# Patient Record
Sex: Male | Born: 1955 | Race: White | Hispanic: No | Marital: Married | Smoking: Never smoker
Health system: Southern US, Community
[De-identification: ages and names within clinical notes are randomized; demographics above are authoritative.]

## PROBLEM LIST (undated history)

## (undated) DIAGNOSIS — E785 Hyperlipidemia, unspecified: Secondary | ICD-10-CM

## (undated) DIAGNOSIS — E669 Obesity, unspecified: Secondary | ICD-10-CM

## (undated) DIAGNOSIS — I10 Essential (primary) hypertension: Secondary | ICD-10-CM

## (undated) DIAGNOSIS — E119 Type 2 diabetes mellitus without complications: Secondary | ICD-10-CM

## (undated) HISTORY — PX: KNEE SURGERY: SHX244

## (undated) HISTORY — DX: Essential (primary) hypertension: I10

## (undated) HISTORY — DX: Hyperlipidemia, unspecified: E78.5

## (undated) HISTORY — DX: Obesity, unspecified: E66.9

## (undated) HISTORY — DX: Type 2 diabetes mellitus without complications: E11.9

---

## 2012-02-29 ENCOUNTER — Ambulatory Visit: Payer: Self-pay | Admitting: Internal Medicine

## 2012-03-01 ENCOUNTER — Other Ambulatory Visit (INDEPENDENT_AMBULATORY_CARE_PROVIDER_SITE_OTHER): Payer: BC Managed Care – PPO

## 2012-03-01 ENCOUNTER — Encounter: Payer: Self-pay | Admitting: Internal Medicine

## 2012-03-01 ENCOUNTER — Ambulatory Visit (INDEPENDENT_AMBULATORY_CARE_PROVIDER_SITE_OTHER): Payer: BC Managed Care – PPO | Admitting: Internal Medicine

## 2012-03-01 VITALS — BP 140/98 | HR 103 | Temp 98.8°F | Ht 72.0 in | Wt 319.2 lb

## 2012-03-01 DIAGNOSIS — Z125 Encounter for screening for malignant neoplasm of prostate: Secondary | ICD-10-CM

## 2012-03-01 DIAGNOSIS — Z13220 Encounter for screening for lipoid disorders: Secondary | ICD-10-CM

## 2012-03-01 DIAGNOSIS — Z1211 Encounter for screening for malignant neoplasm of colon: Secondary | ICD-10-CM

## 2012-03-01 DIAGNOSIS — Z130 Encounter for screening for diseases of the blood and blood-forming organs and certain disorders involving the immune mechanism: Secondary | ICD-10-CM

## 2012-03-01 DIAGNOSIS — Z131 Encounter for screening for diabetes mellitus: Secondary | ICD-10-CM

## 2012-03-01 DIAGNOSIS — Z0000 Encounter for general adult medical examination without abnormal findings: Secondary | ICD-10-CM

## 2012-03-01 LAB — BASIC METABOLIC PANEL
BUN: 23 mg/dL (ref 6–23)
Calcium: 9.6 mg/dL (ref 8.4–10.5)
Chloride: 104 mEq/L (ref 96–112)
Creatinine, Ser: 1.2 mg/dL (ref 0.4–1.5)

## 2012-03-01 LAB — CBC
HCT: 39.7 % (ref 39.0–52.0)
Hemoglobin: 13.7 g/dL (ref 13.0–17.0)
MCHC: 34.4 g/dL (ref 30.0–36.0)
MCV: 92.9 fl (ref 78.0–100.0)
Platelets: 272 10*3/uL (ref 150.0–400.0)

## 2012-03-01 LAB — LIPID PANEL
Cholesterol: 241 mg/dL — ABNORMAL HIGH (ref 0–200)
Total CHOL/HDL Ratio: 6

## 2012-03-01 NOTE — Patient Instructions (Signed)
Health Maintenance, Males A healthy lifestyle and preventative care can promote health and wellness.  Maintain regular health, dental, and eye exams.  Eat a healthy diet. Foods like vegetables, fruits, whole grains, low-fat dairy products, and lean protein foods contain the nutrients you need without too many calories. Decrease your intake of foods high in solid fats, added sugars, and salt. Get information about a proper diet from your caregiver, if necessary.  Regular physical exercise is one of the most important things you can do for your health. Most adults should get at least 150 minutes of moderate-intensity exercise (any activity that increases your heart rate and causes you to sweat) each week. In addition, most adults need muscle-strengthening exercises on 2 or more days a week.   Maintain a healthy weight. The body mass index (BMI) is a screening tool to identify possible weight problems. It provides an estimate of body fat based on height and weight. Your caregiver can help determine your BMI, and can help you achieve or maintain a healthy weight. For adults 20 years and older:  A BMI below 18.5 is considered underweight.  A BMI of 18.5 to 24.9 is normal.  A BMI of 25 to 29.9 is considered overweight.  A BMI of 30 and above is considered obese.  Maintain normal blood lipids and cholesterol by exercising and minimizing your intake of saturated fat. Eat a balanced diet with plenty of fruits and vegetables. Blood tests for lipids and cholesterol should begin at age 20 and be repeated every 5 years. If your lipid or cholesterol levels are high, you are over 50, or you are a high risk for heart disease, you may need your cholesterol levels checked more frequently.Ongoing high lipid and cholesterol levels should be treated with medicines, if diet and exercise are not effective.  If you smoke, find out from your caregiver how to quit. If you do not use tobacco, do not start.  If you  choose to drink alcohol, do not exceed 2 drinks per day. One drink is considered to be 12 ounces (355 mL) of beer, 5 ounces (148 mL) of wine, or 1.5 ounces (44 mL) of liquor.  Avoid use of street drugs. Do not share needles with anyone. Ask for help if you need support or instructions about stopping the use of drugs.  High blood pressure causes heart disease and increases the risk of stroke. Blood pressure should be checked at least every 1 to 2 years. Ongoing high blood pressure should be treated with medicines if weight loss and exercise are not effective.  If you are 45 to 55 years old, ask your caregiver if you should take aspirin to prevent heart disease.  Diabetes screening involves taking a blood sample to check your fasting blood sugar level. This should be done once every 3 years, after age 45, if you are within normal weight and without risk factors for diabetes. Testing should be considered at a younger age or be carried out more frequently if you are overweight and have at least 1 risk factor for diabetes.  Colorectal cancer can be detected and often prevented. Most routine colorectal cancer screening begins at the age of 50 and continues through age 75. However, your caregiver may recommend screening at an earlier age if you have risk factors for colon cancer. On a yearly basis, your caregiver may provide home test kits to check for hidden blood in the stool. Use of a small camera at the end of a tube,   to directly examine the colon (sigmoidoscopy or colonoscopy), can detect the earliest forms of colorectal cancer. Talk to your caregiver about this at age 50, when routine screening begins. Direct examination of the colon should be repeated every 5 to 10 years through age 75, unless early forms of pre-cancerous polyps or small growths are found.  Hepatitis C blood testing is recommended for all people born from 1945 through 1965 and any individual with known risks for hepatitis C.  Healthy  men should no longer receive prostate-specific antigen (PSA) blood tests as part of routine cancer screening. Consult with your caregiver about prostate cancer screening.  Testicular cancer screening is not recommended for adolescents or adult males who have no symptoms. Screening includes self-exam, caregiver exam, and other screening tests. Consult with your caregiver about any symptoms you have or any concerns you have about testicular cancer.  Practice safe sex. Use condoms and avoid high-risk sexual practices to reduce the spread of sexually transmitted infections (STIs).  Use sunscreen with a sun protection factor (SPF) of 30 or greater. Apply sunscreen liberally and repeatedly throughout the day. You should seek shade when your shadow is shorter than you. Protect yourself by wearing long sleeves, pants, a wide-brimmed hat, and sunglasses year round, whenever you are outdoors.  Notify your caregiver of new moles or changes in moles, especially if there is a change in shape or color. Also notify your caregiver if a mole is larger than the size of a pencil eraser.  A one-time screening for abdominal aortic aneurysm (AAA) and surgical repair of large AAAs by sound wave imaging (ultrasonography) is recommended for ages 65 to 75 years who are current or former smokers.  Stay current with your immunizations. Document Released: 08/15/2007 Document Revised: 05/11/2011 Document Reviewed: 07/14/2010 [employer] Patient Information 2013 [employer].  

## 2012-03-01 NOTE — Progress Notes (Signed)
HPI  Pt presents to the clinic today to establish care. He has not seen a PCP in a number of years. He has no concerns today. He is accompanied by his daughter who does have some concerns today. 1: he snores very loudly and sometimes goes for periods of time without breathing. He falls asleep very easy during the day. He has been doing this for a number of years. 2. He has a lot of swelling in his legs. This is new over the past year. They have no idea where it is coming from. It is better for thing in the morning and gets worse as the day progresses.  Flu: never Colonoscopy: never Prostate screening: never Eye doctor: yearly Dentist: more than 2 years ago. Tetanus: greater than 10 years ago  History reviewed. No pertinent past medical history.  No current outpatient prescriptions on file.    No Known Allergies  Family History  Problem Relation Age of Onset  . Heart disease Father   . Cancer Neg Hx   . Diabetes Neg Hx   . Stroke Neg Hx     History   Social History  . Marital Status: Single    Spouse Name: N/A    Number of Children: N/A  . Years of Education: N/A   Occupational History  . Not on file.   Social History Main Topics  . Smoking status: Current Every Day Smoker -- 1.0 packs/day for 15 years  . Smokeless tobacco: Never Used  . Alcohol Use: 16.8 oz/week    28 Cans of beer per week  . Drug Use: No  . Sexually Active: Not on file   Other Topics Concern  . Not on file   Social History Narrative  . No narrative on file    ROS:  Constitutional: Denies fever, malaise, fatigue, headache or abrupt weight changes.  HEENT: Denies eye pain, eye redness, ear pain, ringing in the ears, wax buildup, runny nose, nasal congestion, bloody nose, or sore throat. Respiratory: Denies difficulty breathing, shortness of breath, cough or sputum production.   Cardiovascular: Pt does reports bilateral ankle swelling. Denies chest pain, chest tightness, palpitations or swelling  in the hands.  Gastrointestinal: Denies abdominal pain, bloating, constipation, diarrhea or blood in the stool.  GU: Denies frequency, urgency, pain with urination, blood in urine, odor or discharge. Musculoskeletal: Denies decrease in range of motion, difficulty with gait, muscle pain or joint pain and swelling.  Skin: Denies redness, rashes, lesions or ulcercations.  Neurological: Denies dizziness, difficulty with memory, difficulty with speech or problems with balance and coordination.   No other specific complaints in a complete review of systems (except as listed in HPI above).  PE:  BP 140/98  Pulse 103  Temp 98.8 F (37.1 C) (Oral)  Ht 6' (1.829 m)  Wt 319 lb 3.2 oz (144.788 kg)  BMI 43.29 kg/m2  SpO2 98% Wt Readings from Last 3 Encounters:  03/01/12 319 lb 3.2 oz (144.788 kg)    General: Appears his stated age, obese but well developed, well nourished in NAD. HEENT: Head: normal shape and size; Eyes: sclera white, no icterus, conjunctiva pink, PERRLA and EOMs intact; Ears: Tm's gray and intact, normal light reflex; Nose: mucosa pink and moist, septum midline; Throat/Mouth: Teeth present, mucosa pink and moist, no lesions or ulcerations noted.  Neck: Normal range of motion. Neck supple, trachea midline. No massses, lumps or thyromegaly present.  Cardiovascular: Normal rate and rhythm. S1,S2 noted.  No murmur, rubs or gallops  noted. No JVD. No carotid bruits noted. 2+ pitting edema of the BLE. Pulmonary/Chest: Normal effort and positive vesicular breath sounds. No respiratory distress. No wheezes, rales or ronchi noted.  Abdomen: Soft and nontender. Normal bowel sounds, no bruits noted. No distention or masses noted. Liver, spleen and kidneys non palpable. Musculoskeletal: Normal range of motion. No signs of joint swelling. No difficulty with gait.  Neurological: Alert and oriented. Cranial nerves II-XII intact. Coordination normal. +DTRs bilaterally. Psychiatric: Mood and affect  normal. Behavior is normal. Judgment and thought content normal.    Assessment and Plan:  Preventative Health Maintenance:  Start a diet and exercise program Smoking cessation counseling given Pt declines flu vaccine Tdap given today Will obtain basic screening labs amb ref to GI for screening colonoscopy  Snoring, likely due to sleep apnea, new onset with additional workup required:  Pt not ready to have sleep study at this time. Will readdress at a later date  Elevated blood pressures:  I would like to start the patient on HCTZ for blood pressure and swelling. Patient would like to think about this before starting this med  RTC after we receive lab results

## 2012-03-03 ENCOUNTER — Encounter: Payer: Self-pay | Admitting: Internal Medicine

## 2012-03-07 ENCOUNTER — Ambulatory Visit: Payer: BC Managed Care – PPO | Admitting: Internal Medicine

## 2012-04-12 ENCOUNTER — Encounter: Payer: BC Managed Care – PPO | Admitting: Internal Medicine

## 2012-05-26 ENCOUNTER — Other Ambulatory Visit (INDEPENDENT_AMBULATORY_CARE_PROVIDER_SITE_OTHER): Payer: BC Managed Care – PPO

## 2012-05-26 ENCOUNTER — Ambulatory Visit (INDEPENDENT_AMBULATORY_CARE_PROVIDER_SITE_OTHER): Payer: BC Managed Care – PPO | Admitting: Internal Medicine

## 2012-05-26 ENCOUNTER — Encounter: Payer: Self-pay | Admitting: Internal Medicine

## 2012-05-26 VITALS — BP 142/88 | HR 97 | Temp 97.7°F | Ht 72.0 in | Wt 326.0 lb

## 2012-05-26 DIAGNOSIS — E785 Hyperlipidemia, unspecified: Secondary | ICD-10-CM

## 2012-05-26 DIAGNOSIS — E119 Type 2 diabetes mellitus without complications: Secondary | ICD-10-CM

## 2012-05-26 DIAGNOSIS — E669 Obesity, unspecified: Secondary | ICD-10-CM

## 2012-05-26 DIAGNOSIS — I10 Essential (primary) hypertension: Secondary | ICD-10-CM

## 2012-05-26 DIAGNOSIS — E1169 Type 2 diabetes mellitus with other specified complication: Secondary | ICD-10-CM | POA: Insufficient documentation

## 2012-05-26 LAB — HEPATIC FUNCTION PANEL
ALT: 55 U/L — ABNORMAL HIGH (ref 0–53)
Total Bilirubin: 0.5 mg/dL (ref 0.3–1.2)
Total Protein: 7.1 g/dL (ref 6.0–8.3)

## 2012-05-26 MED ORDER — HYDROCHLOROTHIAZIDE 25 MG PO TABS: 25.0000 mg | ORAL_TABLET | Freq: Every day | ORAL | Status: DC

## 2012-05-26 MED ORDER — METFORMIN HCL 500 MG PO TABS: 500.0000 mg | ORAL_TABLET | Freq: Every day | ORAL | Status: DC

## 2012-05-26 MED ORDER — SIMVASTATIN 10 MG PO TABS: 10.0000 mg | ORAL_TABLET | Freq: Every day | ORAL | Status: DC

## 2012-05-26 NOTE — Assessment & Plan Note (Signed)
Encouraged pt to start a exercise regimen Will start HCTZ 25 mg daily Try to monitor blood pressure 3 x week  RTC in 1 month for blood pressure check

## 2012-05-26 NOTE — Assessment & Plan Note (Signed)
Encouraged pt to start a exercise regimen Encouraged pt to avoid fried and fatty foods in his diet Will start Zocor 10 mg daily Will check LFT's today for baseline

## 2012-05-26 NOTE — Assessment & Plan Note (Signed)
Encourage pt to start exercise regimen Encouraged pt to avoid carbs in his diet- education provided Will start Metformin 500 mg daily  Will recheck labs in 3 months

## 2012-05-26 NOTE — Progress Notes (Signed)
Subjective:    Patient ID: Casey Clark, male    DOB: [birth year], 56 y.o.   MRN: [id number]  HPI  Pt presents to the clinic to f/u his labs. At his last visit in Dec 2013, his labs revealed that he had elevated cholesterol , LDL, triglycerides, and his HgBa1c was elevated at 6.6%. He also has high blood pressure. He does not take any medications. His does not exercise and eats fast food all of the time. 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 He is interested in trying to take better care of himself and he would like to do whatever necessary to make that happen.  Review of Systems      History reviewed. No pertinent past medical history.  Current Outpatient Prescriptions  Medication Sig Dispense Refill  . hydrochlorothiazide (HYDRODIURIL) 25 MG tablet Take 1 tablet (25 mg total) by mouth daily.  30 tablet  2  . metFORMIN (GLUCOPHAGE) 500 MG tablet Take 1 tablet (500 mg total) by mouth daily with breakfast.  30 tablet  2  . simvastatin (ZOCOR) 10 MG tablet Take 1 tablet (10 mg total) by mouth at bedtime.  90 tablet  3   No current facility-administered medications for this visit.    No Known Allergies  Family History  Problem Relation Age of Onset  . Heart disease Father   . Cancer Neg Hx   . Diabetes Neg Hx   . Stroke Neg Hx     History   Social History  . Marital Status: Single    Spouse Name: N/A    Number of Children: N/A  . Years of Education: N/A   Occupational History  . Not on file.   Social History Main Topics  . Smoking status: Current Every Day Smoker -- 1.00 packs/day for 15 years  . Smokeless tobacco: Never Used  . Alcohol Use: 16.8 oz/week    28 Cans of beer per week  . Drug Use: No  . Sexually Active: Not on file   Other Topics Concern  . Not on file   Social History Narrative  . No narrative on file     Constitutional: Denies fever, malaise, fatigue, headache or abrupt weight  changes.  HEENT: Denies eye pain, eye redness, ear pain, ringing in the ears, wax buildup, runny nose, nasal congestion, bloody nose, or sore throat. Respiratory: Denies difficulty breathing, shortness of breath, cough or sputum production.   Cardiovascular: Pt reports swelling of BLE. Denies chest pain, chest tightness, palpitations or swelling in the hands.  Skin: Denies redness, rashes, lesions or ulcercations.  Neurological: Denies dizziness, difficulty with memory, difficulty with speech or problems with balance and coordination.   No other specific complaints in a complete review of systems (except as listed in HPI above).  Objective:   Physical Exam   BP 142/88  Pulse 97  Temp(Src) 97.7 F (36.5 C) (Oral)  Ht 6' (1.829 m)  Wt 326 lb (147.873 kg)  BMI 44.2 kg/m2  SpO2 97% Wt Readings from Last 3 Encounters:  05/26/12 326 lb (147.873 kg)  03/01/12 319 lb 3.2 oz (144.788 kg)    General: Appears his stated age, well developed, well nourished in NAD. Skin: Warm, dry and intact. No rashes, lesions or ulcerations noted. HEENT: Head: normal shape and size; Eyes: sclera white, no icterus, conjunctiva pink, PERRLA and EOMs intact; Ears: Tm's gray and intact, normal light reflex; Nose:  mucosa pink and moist, septum midline; Throat/Mouth: Teeth present, mucosa pink and moist, no exudate, lesions or ulcerations noted.  Cardiovascular: Normal rate and rhythm. S1,S2 noted.  No murmur, rubs or gallops noted. No JVD. 1 + pitting edema BLE. No carotid bruits noted. Pulmonary/Chest: Normal effort and positive vesicular breath sounds. No respiratory distress. No wheezes, rales or ronchi noted.  Neurological: Alert and oriented. Cranial nerves II-XII intact. Coordination normal. +DTRs bilaterally.  BMET    Component Value Date/Time   NA 139 03/01/2012 1441   K 4.7 03/01/2012 1441   CL 104 03/01/2012 1441   CO2 27 03/01/2012 1441   GLUCOSE 144* 03/01/2012 1441   BUN 23 03/01/2012 1441    CREATININE 1.2 03/01/2012 1441   CALCIUM 9.6 03/01/2012 1441    Lipid Panel     Component Value Date/Time   CHOL 241* 03/01/2012 1441   TRIG 307.0* 03/01/2012 1441   HDL 37.50* 03/01/2012 1441   CHOLHDL 6 03/01/2012 1441   VLDL 61.4* 03/01/2012 1441    CBC    Component Value Date/Time   WBC 7.3 03/01/2012 1441   RBC 4.27 03/01/2012 1441   HGB 13.7 03/01/2012 1441   HCT 39.7 03/01/2012 1441   PLT 272.0 03/01/2012 1441   MCV 92.9 03/01/2012 1441   MCHC 34.4 03/01/2012 1441   RDW 13.4 03/01/2012 1441    Hgb A1C Lab Results  Component Value Date   HGBA1C 6.6* 03/01/2012        Assessment & Plan:

## 2012-05-26 NOTE — Patient Instructions (Signed)
Fat and Cholesterol Control Diet Cholesterol levels in your body are determined significantly by your diet. Cholesterol levels may also be related to heart disease. The following material helps to explain this relationship and discusses what you can do to help keep your heart healthy. Not all cholesterol is bad. Low-density lipoprotein (LDL) cholesterol is the "bad" cholesterol. It may cause fatty deposits to build up inside your arteries. High-density lipoprotein (HDL) cholesterol is "good." It helps to remove the "bad" LDL cholesterol from your blood. Cholesterol is a very important risk factor for heart disease. Other risk factors are high blood pressure, smoking, stress, heredity, and weight. The heart muscle gets its supply of blood through the coronary arteries. If your LDL cholesterol is high and your HDL cholesterol is low, you are at risk for having fatty deposits build up in your coronary arteries. This leaves less room through which blood can flow. Without sufficient blood and oxygen, the heart muscle cannot function properly and you may feel chest pains (angina pectoris). When a coronary artery closes up entirely, a part of the heart muscle may die causing a heart attack (myocardial infarction). CHECKING CHOLESTEROL When your caregiver sends your blood to a lab to be examined for cholesterol, a complete lipid (fat) profile may be done. With this test, the total amount of cholesterol and levels of LDL and HDL are determined. Triglycerides are a type of fat that circulates in the blood. They can also be used to determine heart disease risk. The list below describes what the numbers should be: Test: Total Cholesterol.  Less than 200 mg/dl. Test: LDL "bad cholesterol."  Less than 100 mg/dl.  Less than 70 mg/dl if you are at very high risk of a heart attack or sudden cardiac death. Test: HDL "good cholesterol."  Greater than 50 mg/dl for women.  Greater than 40 mg/dl for men. Test:  Triglycerides.  Less than 150 mg/dl. CONTROLLING CHOLESTEROL WITH DIET Although exercise and lifestyle factors are important, your diet is key. That is because certain foods are known to raise cholesterol and others to lower it. The goal is to balance foods for their effect on cholesterol and more importantly, to replace saturated and trans fat with other types of fat, such as monounsaturated fat, polyunsaturated fat, and omega-3 fatty acids. On average, a person should consume no more than 15 to 17 g of saturated fat daily. Saturated and trans fats are considered "bad" fats, and they will raise LDL cholesterol. Saturated fats are primarily found in animal products such as meats, butter, and cream. However, that does not mean you need to give up all your favorite foods. Today, there are good tasting, low-fat, low-cholesterol substitutes for most of the things you like to eat. Choose low-fat or nonfat alternatives. Choose round or loin cuts of red meat. These types of cuts are lowest in fat and cholesterol. Chicken (without the skin), fish, veal, and ground turkey breast are great choices. Eliminate fatty meats, such as hot dogs and salami. Even shellfish have little or no saturated fat. Have a 3 oz (85 g) portion when you eat lean meat, poultry, or fish. Trans fats are also called "partially hydrogenated oils." They are oils that have been scientifically manipulated so that they are solid at room temperature resulting in a longer shelf life and improved taste and texture of foods in which they are added. Trans fats are found in stick margarine, some tub margarines, cookies, crackers, and baked goods.  When baking and cooking, oils   are a great substitute for butter. The monounsaturated oils are especially beneficial since it is believed they lower LDL and raise HDL. The oils you should avoid entirely are saturated tropical oils, such as coconut and palm.  Remember to eat a lot from food groups that are  naturally free of saturated and trans fat, including fish, fruit, vegetables, beans, grains (barley, rice, couscous, bulgur wheat), and pasta (without cream sauces).  IDENTIFYING FOODS THAT LOWER CHOLESTEROL  Soluble fiber may lower your cholesterol. This type of fiber is found in fruits such as apples, vegetables such as broccoli, potatoes, and carrots, legumes such as beans, peas, and lentils, and grains such as barley. Foods fortified with plant sterols (phytosterol) may also lower cholesterol. You should eat at least 2 g per day of these foods for a cholesterol lowering effect.  Read package labels to identify low-saturated fats, trans fat free, and low-fat foods at the supermarket. Select cheeses that have only 2 to 3 g saturated fat per ounce. Use a heart-healthy tub margarine that is free of trans fats or partially hydrogenated oil. When buying baked goods (cookies, crackers), avoid partially hydrogenated oils. Breads and muffins should be made from whole grains (whole-wheat or whole oat flour, instead of "flour" or "enriched flour"). Buy non-creamy canned soups with reduced salt and no added fats.  FOOD PREPARATION TECHNIQUES  Never deep-fry. If you must fry, either stir-fry, which uses very little fat, or use non-stick cooking sprays. When possible, broil, bake, or roast meats, and steam vegetables. Instead of putting butter or margarine on vegetables, use lemon and herbs, applesauce, and cinnamon (for squash and sweet potatoes), nonfat yogurt, salsa, and low-fat dressings for salads.  LOW-SATURATED FAT / LOW-FAT FOOD SUBSTITUTES Meats / Saturated Fat (g)  Avoid: Steak, marbled (3 oz/85 g) / 11 g  Choose: Steak, lean (3 oz/85 g) / 4 g  Avoid: Hamburger (3 oz/85 g) / 7 g  Choose: Hamburger, lean (3 oz/85 g) / 5 g  Avoid: Ham (3 oz/85 g) / 6 g  Choose: Ham, lean cut (3 oz/85 g) / 2.4 g  Avoid: Chicken, with skin, dark meat (3 oz/85 g) / 4 g  Choose: Chicken, skin removed, dark meat (3  oz/85 g) / 2 g  Avoid: Chicken, with skin, light meat (3 oz/85 g) / 2.5 g  Choose: Chicken, skin removed, light meat (3 oz/85 g) / 1 g Dairy / Saturated Fat (g)  Avoid: Whole milk (1 cup) / 5 g  Choose: Low-fat milk, 2% (1 cup) / 3 g  Choose: Low-fat milk, 1% (1 cup) / 1.5 g  Choose: Skim milk (1 cup) / 0.3 g  Avoid: Hard cheese (1 oz/28 g) / 6 g  Choose: Skim milk cheese (1 oz/28 g) / 2 to 3 g  Avoid: Cottage cheese, 4% fat (1 cup) / 6.5 g  Choose: Low-fat cottage cheese, 1% fat (1 cup) / 1.5 g  Avoid: Ice cream (1 cup) / 9 g  Choose: Sherbet (1 cup) / 2.5 g  Choose: Nonfat frozen yogurt (1 cup) / 0.3 g  Choose: Frozen fruit bar / trace  Avoid: Whipped cream (1 tbs) / 3.5 g  Choose: Nondairy whipped topping (1 tbs) / 1 g Condiments / Saturated Fat (g)  Avoid: Mayonnaise (1 tbs) / 2 g  Choose: Low-fat mayonnaise (1 tbs) / 1 g  Avoid: Butter (1 tbs) / 7 g  Choose: Extra light margarine (1 tbs) / 1 g  Avoid: Coconut oil (1   tbs) / 11.8 g  Choose: Olive oil (1 tbs) / 1.8 g  Choose: Corn oil (1 tbs) / 1.7 g  Choose: Safflower oil (1 tbs) / 1.2 g  Choose: Sunflower oil (1 tbs) / 1.4 g  Choose: Soybean oil (1 tbs) / 2.4 g  Choose: Canola oil (1 tbs) / 1 g Document Released: 02/16/2005 Document Revised: 05/11/2011 Document Reviewed: 08/07/2010 [facility] Patient Information 2013 [place], [place]. Diets for Diabetes, Food Labeling Look at food labels to help you decide how much of a product you can eat. You will want to check the amount of total carbohydrate in a serving to see how the food fits into your meal plan. In the list of ingredients, the ingredient present in the largest amount by weight must be listed first, followed by the other ingredients in descending order. STANDARD OF IDENTITY Most products have a list of ingredients. However, foods that the Food and Drug Administration (FDA) has given a standard of identity do not need a list of ingredients. A  standard of identity means that a food must contain certain ingredients if it is called a particular name. Examples are mayonnaise, peanut butter, ketchup, jelly, and cheese. LABELING TERMS There are many terms found on food labels. Some of these terms have specific definitions. Some terms are regulated by the FDA, and the FDA has clearly specified how they can be used. Others are not regulated or well-defined and can be misleading and confusing. SPECIFICALLY DEFINED TERMS Nutritive Sweetener.  A sweetener that contains calories,such as table sugar or honey. Nonnutritive Sweetener.  A sweetener with few or no calories,such as saccharin, aspartame, sucralose, and cyclamate. LABELING TERMS REGULATED BY THE FDA Free.  The product contains only a tiny or small amount of fat, cholesterol, sodium, sugar, or calories. For example, a "fat-free" product will contain less than 0.5 g of fat per serving. Low.  A food described as "low" in fat, saturated fat, cholesterol, sodium, or calories could be eaten fairly often without exceeding dietary guidelines. For example, "low in fat" means no more than 3 g of fat per serving. Lean.  "Lean" and "extra lean" are U.S. Department of Agriculture Architect) terms for use on meat and poultry products. "Lean" means the product contains less than 10 g of fat, 4 g of saturated fat, and 95 mg of cholesterol per serving. "Lean" is not as low in fat as a product labeled "low." Extra Lean.  "Extra lean" means the product contains less than 5 g of fat, 2 g of saturated fat, and 95 mg of cholesterol per serving. While "extra lean" has less fat than "lean," it is still higher in fat than a product labeled "low." Reduced, Less, Fewer.  A diet product that contains 25% less of a nutrient or calories than the regular version. For example, hot dogs might be labeled "25% less fat than our regular hot dogs." Light/Lite.  A diet product that contains  fewer calories or  the fat  of the original. For example, "light in sodium" means a product with  the usual sodium. More.  One serving contains at least 10% more of the daily value of a vitamin, mineral, or fiber than usual. Good Source Of.  One serving contains 10% to 19% of the daily value for a particular vitamin, mineral, or fiber. Excellent Source Of.  One serving contains 20% or more of the daily value for a particular nutrient. Other terms used might be "high in" or "rich in." Enriched or  Fortified.  The product contains added vitamins, minerals, or protein. Nutrition labeling must be used on enriched or fortified foods. Imitation.  The product has been altered so that it is lower in protein, vitamins, or minerals than the usual food,such as imitation peanut butter. Total Fat.  The number listed is the total of all fat found in a serving of the product. Under total fat, food labels must list saturated fat and trans fat, which are associated with raising bad cholesterol and an increased risk of heart blood vessel disease. Saturated Fat.  Mainly fats from animal-based sources. Some examples are red meat, cheese, cream, whole milk, and coconut oil. Trans Fat.  Found in some fried snack foods, packaged foods, and fried restaurant foods. It is recommended you eat as close to 0 g of trans fat as possible, since it raises bad cholesterol and lowers good cholesterol. Polyunsaturated and Monounsaturated Fats.  More healthful fats. These fats are from plant sources. Total Carbohydrate.  The number of carbohydrate grams in a serving of the product. Under total carbohydrate are listed the other carbohydrate sources, such as dietary fiber and sugars. Dietary Fiber.  A carbohydrate from plant sources. Sugars.  Sugars listed on the label contain all naturally occurring sugars as well as added sugars. LABELING TERMS NOT REGULATED BY THE FDA Sugarless.  Table sugar (sucrose) has not been added. However, the  manufacturer may use another form of sugar in place of sucrose to sweeten the product. For example, sugar alcohols are used to sweeten foods. Sugar alcohols are a form of sugar but are not table sugar. If a product contains sugar alcohols in place of sucrose, it can still be labeled "sugarless." Low Salt, Salt-Free, Unsalted, No Salt, No Salt Added, Without Added Salt.  Food that is usually processed with salt has been made without salt. However, the food may contain sodium-containing additives, such as preservatives, leavening agents, or flavorings. Natural.  This term has no legal meaning. Organic.  Foods that are certified as organic have been inspected and approved by the USDA to ensure they are produced without pesticides, fertilizers containing synthetic ingredients, bioengineering, or ionizing radiation. Document Released: 02/19/2003 Document Revised: 05/11/2011 Document Reviewed: 09/06/2008 [facility] Patient Information 2013 [place], [place].

## 2012-06-23 ENCOUNTER — Ambulatory Visit: Payer: BC Managed Care – PPO | Admitting: Internal Medicine

## 2012-06-23 DIAGNOSIS — Z0289 Encounter for other administrative examinations: Secondary | ICD-10-CM

## 2012-07-14 ENCOUNTER — Encounter: Payer: Self-pay | Admitting: Internal Medicine

## 2012-07-14 ENCOUNTER — Ambulatory Visit (INDEPENDENT_AMBULATORY_CARE_PROVIDER_SITE_OTHER): Payer: BC Managed Care – PPO | Admitting: Internal Medicine

## 2012-07-14 VITALS — BP 138/88 | HR 88 | Temp 97.6°F | Ht 72.0 in | Wt 321.0 lb

## 2012-07-14 DIAGNOSIS — I10 Essential (primary) hypertension: Secondary | ICD-10-CM

## 2012-07-14 DIAGNOSIS — E785 Hyperlipidemia, unspecified: Secondary | ICD-10-CM

## 2012-07-14 DIAGNOSIS — E669 Obesity, unspecified: Secondary | ICD-10-CM

## 2012-07-14 DIAGNOSIS — E119 Type 2 diabetes mellitus without complications: Secondary | ICD-10-CM

## 2012-07-14 NOTE — Patient Instructions (Signed)
2000 Calorie Diabetic Diet  The 2000 calorie diabetic diet is designed for eating up to 2000 calories each day. Following this diet and making healthy meal choices can help improve overall health. It controls blood glucose (sugar) levels. It can also lower blood pressure and cholesterol.  SERVING SIZES  Measuring foods and serving sizes helps to make sure you are getting the right amount of food. The list below tells how big or small some common serving sizes are.  · 1 oz.........4 stacked dice.  · 3 oz.........Deck of cards.  · 1 tsp........Tip of little finger.  · 1 tbs........Thumb.  · 2 tbs........Golf ball.  · ½ cup.......Half of a fist.  · 1 cup........A fist.  GUIDELINES FOR CHOOSING FOODS  The goal of this diet is to eat a variety of foods and limit calories to 2000 each day. This can be done by choosing foods that are low in calories and fat. The diet also suggests eating small amounts of food often. Doing this helps control your blood glucose levels so they do not get too high or too low. Each meal or snack should contain a protein food source to help you feel more satisfied and to stabilize your blood glucose. Try to eat about the same amount of food around the same time each day. This includes weekend days, travel days, and days off work. Space your meals about 4 to 5 hours apart and add a snack between them if you wish.  For example, a daily food plan could include breakfast, a morning snack, lunch, dinner, and an evening snack. Healthy meals and snacks include whole grains, vegetables, fruits, lean meats, poultry, fish, and dairy products. As you plan your meals, choose a variety of foods. Choose from the bread and starches, vegetables, fruit, dairy, and meat/protein groups. Examples of foods from each group are listed below with their suggested serving sizes. Use measuring cups and spoons to become familiar with what a healthy portion looks like.  Bread and Starches  Each serving equals 15 grams of  carbohydrates.  · 1 slice bread.  · ¼ bagel.  · ¾ cup or 1 cup cold cereal (unsweetened).  · ½ cup hot cereal or mashed potatoes.  · 1 small potato (size of a computer mouse).  · ? cup cooked pasta or rice.  · ½ English muffin.  · 1 cup broth-based soup.  · 3 cups popcorn.  · 4 to 6 whole-wheat crackers.  · ½ cup cooked beans, peas, or corn.  Vegetables  Each serving equals 5 grams of carbohydrates.  · ½ cup cooked vegetables.  · 1 cup raw vegetables.  · ½ cup tomato juice.  Fruit  Each serving equals 15 grams of carbohydrates.  · 1 small apple, banana, or orange.  · 1 ¼ cup watermelon or strawberries.  · ½ cup applesauce (no sugar added).  · 2 tbs raisins.  · ½ banana.  · ½ cup unsweetened canned fruit.  · ½ cup unsweetened fruit juice.  Dairy  Each serving equals 12 to 15 grams of carbohydrates.  · 1 cup fat-free milk.  · 6 oz artificially sweetened yogurt.  · 1 cup buttermilk.  · 1 cup soy milk.  Meat/Protein  · 1 large egg.  · 2 to 3 oz meat, poultry, or fish.  · ½ cup cottage cheese.  · 1 tbs peanut butter.  · ½ cup tofu.  · 1 oz cheese.  · ¼ cup tuna canned in water.  SAMPLE   2000 CALORIE DIET PLAN  Breakfast  · 1 English muffin (2 carb servings).  · Reduced fat cream cheese, 1 tbs.  · 1 scrambled egg.  · ½ grapefruit (1 carb serving).  · Fat-free milk, 1 cup (1 carb serving).  Morning Snack  · Artificially sweetened yogurt, 6 oz (1 carb serving).  · 2 tbs chopped nuts.  · 1 small peach (1 carb serving).  Lunch  · Grilled chicken sandwich.  · 1 hamburger bun (2 carb servings).  · 2 oz chicken breast.  · 1 lettuce leaf.  · 2 slices tomato.  · Reduced fat mayonnaise, 1 tbs.  · Carrot sticks, 1 cup.  · Celery, 1 cup.  · 1 small apple (1 carb serving).  · Fat-free milk, 1 cup (1 carb serving).  Afternoon Snack  · ½ cup low-fat cottage cheese.  · 1 ¼ cups strawberries (1 carb serving).  Dinner  · Steak fajitas.  · 2 oz lean steak.  · 1 whole-wheat tortilla, 8 inches (1 ½ carb servings).  · Shredded lettuce, ¼  cup.  · 2 slices tomato.  · Salsa, ¼ cup.  · Low-fat sour cream, 2 tbs.  · Brown rice, ? cup (1 carb serving).  · 1 small orange (1 carb serving).  Evening Snack  · 4 reduced fat whole-wheat crackers (1 carb serving).  · 1 tbs peanut butter.  · 12 to 15 grapes (1 carb serving).  MEAL PLAN  Use this worksheet to help you make a daily meal plan based on the 2000 calorie diabetic diet suggestions. The total amount of carbohydrates in your meal or snack is more important than making sure you include all of the food groups at every meal or snack. If you are using this plan to help you control your blood glucose, you may interchange carbohydrate containing foods (dairy, starches, and fruits). Choose a variety of fresh foods of varying colors and flavors. You can choose from the following foods to build your day's meals:  · 11 Starches.  · 4 Vegetables.  · 3 Fruits.  · 3 Dairy.  · 8 oz Meat.  · Up to 6 Fats.  Your dietician can use this worksheet to help you decide how many servings and what types of foods are right for you.  BREAKFAST  Food Group and Servings / Food Choice  Starches ___________________________________________  Dairy ______________________________________________  Fruit ______________________________________________  Meat ______________________________________________  Fat________________________________________________  LUNCH  Food Group and Servings / Food Choice  Starch _____________________________________________  Meat ______________________________________________  Vegetables _________________________________________  Fruit ______________________________________________  Dairy______________________________________________  Fat________________________________________________  AFTERNOON SNACK  Food Group and Servings / Food  Choice  Starch________________________________________________  Meat_________________________________________________  Fruit__________________________________________________  DINNER  Food Group and Servings / Food Choice  Starches ____________________________________________  Meat _______________________________________________  Dairy _______________________________________________  Vegetables __________________________________________  Fruit ________________________________________________  Fat_________________________________________________  EVENING SNACK  Food Group and Servings / Food Choice  Fruit _______________________________________________  Meat _______________________________________________  Starch ______________________________________________  DAILY TOTALS  Starches ________________________  Vegetables ______________________  Fruit ___________________________  Dairy ___________________________  Meat ___________________________  Fat _____________________________  Document Released: 09/08/2004 Document Revised: 05/11/2011 Document Reviewed: 09/24/2008  [employer]® Patient Information ©2013 [employer].

## 2012-07-14 NOTE — Progress Notes (Signed)
Subjective:    Patient ID: Casey Clark, male    DOB: 25-Apr-1955, 57 y.o.   MRN: [id number]  HPI  Patient presents to the office for a 1 month follow up.  1.  Hypertension:  Patient started taking 25 mg HCTZ on 07/05/12 for hypertension.  Patient denies ADRs of the medication but thinks that the swelling in his legs has decreased dramatically.  He denies HA, chest pain, blurred vision.  2.  Diabetes:  Patient started taking Metformin 500 mg.  Patient states that he is doing well.  He denies any GI upset, nausea, vomiting, polyuria, polydipsia, polyphagia.  3.  Hyperlipidemia:  Patient started on simvistatin.  Patient denies myalgias, muscle aches, or abdominal pain.  Patient reports compliance with medication.  4.  Morbid obesity:  Patient states that he has not increased his exercise in the past month.  Patient does admit to eating more fruits and vegetables, and smaller portion sizes.   No past medical history on file.  Family History  Problem Relation Age of Onset  . Heart disease Father   . Cancer Neg Hx   . Diabetes Neg Hx   . Stroke Neg Hx       Review of Systems  Eyes: Negative for visual disturbance.  Respiratory: Negative for chest tightness, shortness of breath and wheezing.   Cardiovascular: Negative for chest pain, palpitations and leg swelling.  Gastrointestinal: Negative for nausea, vomiting, diarrhea and constipation.  Neurological: Negative for dizziness, weakness, light-headedness and headaches.  All other systems reviewed and are negative.       Objective:   Physical Exam  Nursing note and vitals reviewed. Constitutional: He is oriented to person, place, and time. He appears well-developed and well-nourished. No distress.  Obese white male   HENT:  Head: Normocephalic and atraumatic.  Eyes: Conjunctivae are normal. No scleral icterus.  Neck: Normal range of motion. Neck supple.  Cardiovascular: Normal rate, regular rhythm and normal heart sounds.   Exam reveals no gallop and no friction rub.   No murmur heard. Pulmonary/Chest: Effort normal and breath sounds normal. No respiratory distress. He has no wheezes. He has no rales. He exhibits no tenderness.  Musculoskeletal: Normal range of motion.  Neurological: He is alert and oriented to person, place, and time.  Skin: Skin is warm and dry. He is not diaphoretic.  Psychiatric: He has a normal Clark and affect. His behavior is normal.   Filed Vitals:   07/14/12 1358  BP: 138/88  Pulse: 88  Temp: 97.6 F (36.4 C)  TempSrc: Oral  Height: 6' (1.829 m)  Weight: 321 lb (145.605 kg)  SpO2: 97%   Lab Results  Component Value Date   WBC 7.3 03/01/2012   HGB 13.7 03/01/2012   HCT 39.7 03/01/2012   PLT 272.0 03/01/2012   GLUCOSE 144* 03/01/2012   CHOL 241* 03/01/2012   TRIG 307.0* 03/01/2012   HDL 37.50* 03/01/2012   LDLDIRECT 175.5 03/01/2012   ALT 55* 05/26/2012   AST 28 05/26/2012   NA 139 03/01/2012   K 4.7 03/01/2012   CL 104 03/01/2012   CREATININE 1.2 03/01/2012   BUN 23 03/01/2012   CO2 27 03/01/2012   PSA 0.44 03/01/2012   HGBA1C 6.6* 03/01/2012      Assessment & Plan:  Patient presents to the office for one month follow up.    1.  HTN:   BP Readings from Last 3 Encounters:  07/14/12 138/88  05/26/12 142/88  03/01/12 140/98  After one week of therapy it is difficult to determine whether current therapy is effective.  Will recheck BP in 2 months.  2.  Diabetes:  Will check A1C in two months to determine the effectiveness of current therapy. Continue to work on diet and exercise.  No changes currently recommended.  3.  Hyperlipidemia:  Will check lipid panel in 2 months to determine the effectiveness of current therapy no current changes recommended.  4.  Obesity:  Patient was counseled and encouraged to continue dietary changes and simple ways to start incorporating physical activity into his daily routine.  Patient to return in 2 months.  To call sooner if  he has problems.

## 2012-09-13 ENCOUNTER — Ambulatory Visit (INDEPENDENT_AMBULATORY_CARE_PROVIDER_SITE_OTHER): Payer: BC Managed Care – PPO | Admitting: Internal Medicine

## 2012-09-13 ENCOUNTER — Encounter: Payer: Self-pay | Admitting: Internal Medicine

## 2012-09-13 ENCOUNTER — Other Ambulatory Visit (INDEPENDENT_AMBULATORY_CARE_PROVIDER_SITE_OTHER): Payer: BC Managed Care – PPO

## 2012-09-13 ENCOUNTER — Other Ambulatory Visit: Payer: Self-pay | Admitting: Internal Medicine

## 2012-09-13 VITALS — BP 128/88 | HR 84 | Temp 97.9°F | Ht 72.0 in | Wt 322.0 lb

## 2012-09-13 DIAGNOSIS — E785 Hyperlipidemia, unspecified: Secondary | ICD-10-CM

## 2012-09-13 DIAGNOSIS — E1169 Type 2 diabetes mellitus with other specified complication: Secondary | ICD-10-CM

## 2012-09-13 DIAGNOSIS — E119 Type 2 diabetes mellitus without complications: Secondary | ICD-10-CM

## 2012-09-13 DIAGNOSIS — I10 Essential (primary) hypertension: Secondary | ICD-10-CM

## 2012-09-13 DIAGNOSIS — E669 Obesity, unspecified: Secondary | ICD-10-CM

## 2012-09-13 LAB — COMPREHENSIVE METABOLIC PANEL
Albumin: 3.9 g/dL (ref 3.5–5.2)
Alkaline Phosphatase: 64 U/L (ref 39–117)
BUN: 17 mg/dL (ref 6–23)
CO2: 26 mEq/L (ref 19–32)
Calcium: 9.2 mg/dL (ref 8.4–10.5)
GFR: 72.92 mL/min (ref >60.00)
Glucose, Bld: 95 mg/dL (ref 70–99)
Potassium: 4.5 mEq/L (ref 3.5–5.1)

## 2012-09-13 LAB — LIPID PANEL
HDL: 33.4 mg/dL — ABNORMAL LOW (ref >39.00)
Triglycerides: 259 mg/dL — ABNORMAL HIGH (ref 0.0–149.0)
VLDL: 51.8 mg/dL — ABNORMAL HIGH (ref 0.0–40.0)

## 2012-09-13 LAB — LDL CHOLESTEROL, DIRECT: Direct LDL: 133.8 mg/dL

## 2012-09-13 LAB — MICROALBUMIN / CREATININE URINE RATIO: Microalb, Ur: 2.8 mg/dL — ABNORMAL HIGH (ref 0.0–1.9)

## 2012-09-13 MED ORDER — HYDROCHLOROTHIAZIDE 25 MG PO TABS: 25.0000 mg | ORAL_TABLET | Freq: Every day | ORAL | Status: AC

## 2012-09-13 MED ORDER — METFORMIN HCL 500 MG PO TABS: 500.0000 mg | ORAL_TABLET | Freq: Every day | ORAL | Status: AC

## 2012-09-13 MED ORDER — HYDROCHLOROTHIAZIDE 25 MG PO TABS: 25.0000 mg | ORAL_TABLET | Freq: Every day | ORAL | Status: DC

## 2012-09-13 MED ORDER — METFORMIN HCL 500 MG PO TABS: 500.0000 mg | ORAL_TABLET | Freq: Every day | ORAL | Status: DC

## 2012-09-13 MED ORDER — SIMVASTATIN 20 MG PO TABS: 20.0000 mg | ORAL_TABLET | Freq: Every day | ORAL | Status: AC

## 2012-09-13 NOTE — Assessment & Plan Note (Signed)
Will check A1C and microalbumin today Will refill Metformin after labs are back Referred to Nutrition

## 2012-09-13 NOTE — Assessment & Plan Note (Signed)
Much better controlled Refilled HCTZ today

## 2012-09-13 NOTE — Assessment & Plan Note (Signed)
Will recheck lipid profile today Continue simvistatin Continue to work on diet and exercise

## 2012-09-13 NOTE — Progress Notes (Signed)
Subjective:    Patient ID: Casey Clark, male    DOB: 26-Oct-1955, 57 y.o.   MRN: [id number]  HPI  Pt presents to the clinic today for 3 month followup of chronic medical conditions.  HTN- much better controlled. He has been trying to avoid sale in his diet. He has not lost any weight. He is tolerating the medication well without side effects. He has been out of his HCTZ for 2 weeks. He will need a refill today.  DM2- He does not check his sugars. At his last visit, he refused diabetes education. He has been trying to cut back on his carbs but has not lost any weight. He has been tolerating the medication well without side effects. He does need a refill of metformin today.  Hyperlipidemia: Takes simvistatin when he remembers. He has tried to cut back on eating out. He is tolerating the medication well without any side effects.   Review of Systems      History reviewed. No pertinent past medical history.  Current Outpatient Prescriptions  Medication Sig Dispense Refill  . hydrochlorothiazide (HYDRODIURIL) 25 MG tablet Take 1 tablet (25 mg total) by mouth daily.  90 tablet  2  . metFORMIN (GLUCOPHAGE) 500 MG tablet Take 1 tablet (500 mg total) by mouth daily with breakfast.  90 tablet  2  . simvastatin (ZOCOR) 10 MG tablet Take 1 tablet (10 mg total) by mouth at bedtime.  90 tablet  3   No current facility-administered medications for this visit.    No Known Allergies  Family History  Problem Relation Age of Onset  . Heart disease Father   . Cancer Neg Hx   . Diabetes Neg Hx   . Stroke Neg Hx     History   Social History  . Marital Status: Single    Spouse Name: N/A    Number of Children: N/A  . Years of Education: N/A   Occupational History  . Not on file.   Social History Main Topics  . Smoking status: Current Every Day Smoker -- 1.00 packs/day for 15 years  . Smokeless tobacco: Never Used  . Alcohol Use: 16.8 oz/week    28 Cans of beer per week  . Drug Use: No   . Sexually Active: Not on file   Other Topics Concern  . Not on file   Social History Narrative  . No narrative on file     Constitutional: Denies fever, malaise, fatigue, headache or abrupt weight changes.  HEENT: Denies eye pain, eye redness, ear pain, ringing in the ears, wax buildup, runny nose, nasal congestion, bloody nose, or sore throat. Respiratory: Denies difficulty breathing, shortness of breath, cough or sputum production.   Cardiovascular: Denies chest pain, chest tightness, palpitations or swelling in the hands or feet.  Neurological: Denies dizziness, difficulty with memory, difficulty with speech or problems with balance and coordination.   No other specific complaints in a complete review of systems (except as listed in HPI above).  Objective:   Physical Exam  BP 128/88  Pulse 84  Temp(Src) 97.9 F (36.6 C) (Oral)  Ht 6' (1.829 m)  Wt 322 lb (146.058 kg)  BMI 43.66 kg/m2  SpO2 95% Wt Readings from Last 3 Encounters:  09/13/12 322 lb (146.058 kg)  07/14/12 321 lb (145.605 kg)  05/26/12 326 lb (147.873 kg)    General: Appears his stated age, obese but well developed, well nourished in NAD. Skin: Warm, dry and intact. No rashes, lesions  or ulcerations noted. Cardiovascular: Normal rate and rhythm. S1,S2 noted.  No murmur, rubs or gallops noted. No JVD or BLE edema. No carotid bruits noted. Pulmonary/Chest: Normal effort and positive vesicular breath sounds. No respiratory distress. No wheezes, rales or ronchi noted.  Neurological: Alert and oriented. Cranial nerves II-XII intact. Coordination normal. +DTRs bilaterally.  BMET    Component Value Date/Time   NA 139 03/01/2012 1441   K 4.7 03/01/2012 1441   CL 104 03/01/2012 1441   CO2 27 03/01/2012 1441   GLUCOSE 144* 03/01/2012 1441   BUN 23 03/01/2012 1441   CREATININE 1.2 03/01/2012 1441   CALCIUM 9.6 03/01/2012 1441    Lipid Panel     Component Value Date/Time   CHOL 241* 03/01/2012 1441    TRIG 307.0* 03/01/2012 1441   HDL 37.50* 03/01/2012 1441   CHOLHDL 6 03/01/2012 1441   VLDL 61.4* 03/01/2012 1441    CBC    Component Value Date/Time   WBC 7.3 03/01/2012 1441   RBC 4.27 03/01/2012 1441   HGB 13.7 03/01/2012 1441   HCT 39.7 03/01/2012 1441   PLT 272.0 03/01/2012 1441   MCV 92.9 03/01/2012 1441   MCHC 34.4 03/01/2012 1441   RDW 13.4 03/01/2012 1441    Hgb A1C Lab Results  Component Value Date   HGBA1C 6.6* 03/01/2012        Assessment & Plan:

## 2012-09-13 NOTE — Patient Instructions (Signed)
Hypertriglyceridemia  Diet for High blood levels of Triglycerides Most fats in food are triglycerides. Triglycerides in your blood are stored as fat in your body. High levels of triglycerides in your blood may put you at a greater risk for heart disease and stroke.  Normal triglyceride levels are less than 150 mg/dL. Borderline high levels are 150-199 mg/dl. High levels are 200 - 499 mg/dL, and very high triglyceride levels are greater than 500 mg/dL. The decision to treat high triglycerides is generally based on the level. For people with borderline or high triglyceride levels, treatment includes weight loss and exercise. Drugs are recommended for people with very high triglyceride levels. Many people who need treatment for high triglyceride levels have metabolic syndrome. This syndrome is a collection of disorders that often include: insulin resistance, high blood pressure, blood clotting problems, high cholesterol and triglycerides. TESTING PROCEDURE FOR TRIGLYCERIDES  You should not eat 4 hours before getting your triglycerides measured. The normal range of triglycerides is between 10 and 250 milligrams per deciliter (mg/dl). Some people may have extreme levels (1000 or above), but your triglyceride level may be too high if it is above 150 mg/dl, depending on what other risk factors you have for heart disease.  People with high blood triglycerides may also have high blood cholesterol levels. If you have high blood cholesterol as well as high blood triglycerides, your risk for heart disease is probably greater than if you only had high triglycerides. High blood cholesterol is one of the main risk factors for heart disease. CHANGING YOUR DIET  Your weight can affect your blood triglyceride level. If you are more than 20% above your ideal body weight, you may be able to lower your blood triglycerides by losing weight. Eating less and exercising regularly is the best way to combat this. Fat provides more  calories than any other food. The best way to lose weight is to eat less fat. Only 30% of your total calories should come from fat. Less than 7% of your diet should come from saturated fat. A diet low in fat and saturated fat is the same as a diet to decrease blood cholesterol. By eating a diet lower in fat, you may lose weight, lower your blood cholesterol, and lower your blood triglyceride level.  Eating a diet low in fat, especially saturated fat, may also help you lower your blood triglyceride level. Ask your dietitian to help you figure how much fat you can eat based on the number of calories your caregiver has prescribed for you.  Exercise, in addition to helping with weight loss may also help lower triglyceride levels.   Alcohol can increase blood triglycerides. You may need to stop drinking alcoholic beverages.  Too much carbohydrate in your diet may also increase your blood triglycerides. Some complex carbohydrates are necessary in your diet. These may include bread, rice, potatoes, other starchy vegetables and cereals.  Reduce "simple" carbohydrates. These may include pure sugars, candy, honey, and jelly without losing other nutrients. If you have the kind of high blood triglycerides that is affected by the amount of carbohydrates in your diet, you will need to eat less sugar and less high-sugar foods. Your caregiver can help you with this.  Adding 2-4 grams of fish oil (EPA+ DHA) may also help lower triglycerides. Speak with your caregiver before adding any supplements to your regimen. Following the Diet  Maintain your ideal weight. Your caregivers can help you with a diet. Generally, eating less food and getting more   exercise will help you lose weight. Joining a weight control group may also help. Ask your caregivers for a good weight control group in your area.  Eat low-fat foods instead of high-fat foods. This can help you lose weight too.  These foods are lower in fat. Eat MORE of these:    Dried beans, peas, and lentils.  Egg whites.  Low-fat cottage cheese.  Fish.  Lean cuts of meat, such as round, sirloin, rump, and flank (cut extra fat off meat you fix).  Whole grain breads, cereals and pasta.  Skim and nonfat dry milk.  Low-fat yogurt.  Poultry without the skin.  Cheese made with skim or part-skim milk, such as mozzarella, parmesan, farmers', ricotta, or pot cheese. These are higher fat foods. Eat LESS of these:   Whole milk and foods made from whole milk, such as American, blue, cheddar, monterey jack, and swiss cheese  High-fat meats, such as luncheon meats, sausages, knockwurst, bratwurst, hot dogs, ribs, corned beef, ground pork, and regular ground beef.  Fried foods. Limit saturated fats in your diet. Substituting unsaturated fat for saturated fat may decrease your blood triglyceride level. You will need to read package labels to know which products contain saturated fats.  These foods are high in saturated fat. Eat LESS of these:   Fried pork skins.  Whole milk.  Skin and fat from poultry.  Palm oil.  Butter.  Shortening.  Cream cheese.  Bacon.  Margarines and baked goods made from listed oils.  Vegetable shortenings.  Chitterlings.  Fat from meats.  Coconut oil.  Palm kernel oil.  Lard.  Cream.  Sour cream.  Fatback.  Coffee whiteners and non-dairy creamers made with these oils.  Cheese made from whole milk. Use unsaturated fats (both polyunsaturated and monounsaturated) moderately. Remember, even though unsaturated fats are better than saturated fats; you still want a diet low in total fat.  These foods are high in unsaturated fat:   Canola oil.  Sunflower oil.  Mayonnaise.  Almonds.  Peanuts.  Pine nuts.  Margarines made with these oils.  Safflower oil.  Olive oil.  Avocados.  Cashews.  Peanut butter.  Sunflower seeds.  Soybean oil.  Peanut  oil.  Olives.  Pecans.  Walnuts.  Pumpkin seeds. Avoid sugar and other high-sugar foods. This will decrease carbohydrates without decreasing other nutrients. Sugar in your food goes rapidly to your blood. When there is excess sugar in your blood, your liver may use it to make more triglycerides. Sugar also contains calories without other important nutrients.  Eat LESS of these:   Sugar, brown sugar, powdered sugar, jam, jelly, preserves, honey, syrup, molasses, pies, candy, cakes, cookies, frosting, pastries, colas, soft drinks, punches, fruit drinks, and regular gelatin.  Avoid alcohol. Alcohol, even more than sugar, may increase blood triglycerides. In addition, alcohol is high in calories and low in nutrients. Ask for sparkling water, or a diet soft drink instead of an alcoholic beverage. Suggestions for planning and preparing meals   Bake, broil, grill or roast meats instead of frying.  Remove fat from meats and skin from poultry before cooking.  Add spices, herbs, lemon juice or vinegar to vegetables instead of salt, rich sauces or gravies.  Use a non-stick skillet without fat or use no-stick sprays.  Cool and refrigerate stews and broth. Then remove the hardened fat floating on the surface before serving.  Refrigerate meat drippings and skim off fat to make low-fat gravies.  Serve more fish.  Use less butter,   margarine and other high-fat spreads on bread or vegetables.  Use skim or reconstituted non-fat dry milk for cooking.  Cook with low-fat cheeses.  Substitute low-fat yogurt or cottage cheese for all or part of the sour cream in recipes for sauces, dips or congealed salads.  Use half yogurt/half mayonnaise in salad recipes.  Substitute evaporated skim milk for cream. Evaporated skim milk or reconstituted non-fat dry milk can be whipped and substituted for whipped cream in certain recipes.  Choose fresh fruits for dessert instead of high-fat foods such as pies or  cakes. Fruits are naturally low in fat. When Dining Out   Order low-fat appetizers such as fruit or vegetable juice, pasta with vegetables or tomato sauce.  Select clear, rather than cream soups.  Ask that dressings and gravies be served on the side. Then use less of them.  Order foods that are baked, broiled, poached, steamed, stir-fried, or roasted.  Ask for margarine instead of butter, and use only a small amount.  Drink sparkling water, unsweetened tea or coffee, or diet soft drinks instead of alcohol or other sweet beverages. QUESTIONS AND ANSWERS ABOUT OTHER FATS IN THE BLOOD: SATURATED FAT, TRANS FAT, AND CHOLESTEROL What is trans fat? Trans fat is a type of fat that is formed when vegetable oil is hardened through a process called hydrogenation. This process helps makes foods more solid, gives them shape, and prolongs their shelf life. Trans fats are also called hydrogenated or partially hydrogenated oils.  What do saturated fat, trans fat, and cholesterol in foods have to do with heart disease? Saturated fat, trans fat, and cholesterol in the diet all raise the level of LDL "bad" cholesterol in the blood. The higher the LDL cholesterol, the greater the risk for coronary heart disease (CHD). Saturated fat and trans fat raise LDL similarly.  What foods contain saturated fat, trans fat, and cholesterol? High amounts of saturated fat are found in animal products, such as fatty cuts of meat, chicken skin, and full-fat dairy products like butter, whole milk, cream, and cheese, and in tropical vegetable oils such as palm, palm kernel, and coconut oil. Trans fat is found in some of the same foods as saturated fat, such as vegetable shortening, some margarines (especially hard or stick margarine), crackers, cookies, baked goods, fried foods, salad dressings, and other processed foods made with partially hydrogenated vegetable oils. Small amounts of trans fat also occur naturally in some animal  products, such as milk products, beef, and lamb. Foods high in cholesterol include liver, other organ meats, egg yolks, shrimp, and full-fat dairy products. How can I use the new food label to make heart-healthy food choices? Check the Nutrition Facts panel of the food label. Choose foods lower in saturated fat, trans fat, and cholesterol. For saturated fat and cholesterol, you can also use the Percent Daily Value (%DV): 5% DV or less is low, and 20% DV or more is high. (There is no %DV for trans fat.) Use the Nutrition Facts panel to choose foods low in saturated fat and cholesterol, and if the trans fat is not listed, read the ingredients and limit products that list shortening or hydrogenated or partially hydrogenated vegetable oil, which tend to be high in trans fat. POINTS TO REMEMBER:   Discuss your risk for heart disease with your caregivers, and take steps to reduce risk factors.  Change your diet. Choose foods that are low in saturated fat, trans fat, and cholesterol.  Add exercise to your daily routine if   it is not already being done. Participate in physical activity of moderate intensity, like brisk walking, for at least 30 minutes on most, and preferably all days of the week. No time? Break the 30 minutes into three, 10-minute segments during the day.  Stop smoking. If you do smoke, contact your caregiver to discuss ways in which they can help you quit.  Do not use street drugs.  Maintain a normal weight.  Maintain a healthy blood pressure.  Keep up with your blood work for checking the fats in your blood as directed by your caregiver. Document Released: 12/05/2003 Document Revised: 08/18/2011 Document Reviewed: 07/02/2008 [employer] Patient Information 2014 [employer].  

## 2012-10-18 ENCOUNTER — Encounter: Payer: Self-pay | Admitting: *Deleted

## 2012-10-18 ENCOUNTER — Encounter: Payer: BC Managed Care – PPO | Attending: Internal Medicine | Admitting: *Deleted

## 2012-10-18 VITALS — Ht 72.0 in | Wt 317.3 lb

## 2012-10-18 DIAGNOSIS — E1169 Type 2 diabetes mellitus with other specified complication: Secondary | ICD-10-CM

## 2012-10-18 DIAGNOSIS — E119 Type 2 diabetes mellitus without complications: Secondary | ICD-10-CM | POA: Insufficient documentation

## 2012-10-18 DIAGNOSIS — Z713 Dietary counseling and surveillance: Secondary | ICD-10-CM | POA: Insufficient documentation

## 2012-10-18 NOTE — Progress Notes (Signed)
Appt start time: 1500 end time:  1600.  Assessment:  Patient was seen on  10/18/2012 for individual diabetes education. 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 Not checking BG yet.   Current HbA1c: 6.8%  MEDICATIONS: see list, diabetes medication: Metformin   DIETARY INTAKE:  Usual eating pattern includes 3 meals and 0 snacks per day.  Everyday foods include most meals eaten in restaurants or fast food.  Avoided foods include: none stated.    24-hr recall:  B ( AM): eggs and bacon OR biscuit with eggs and bacon, coffee, OJ skips maybe twice a week Snk ( AM): none  L ( PM): left overs from home OR sandwich with baked chips, OR fast food or sit down restaurant with chicken usually, sweet tea or regular soda Snk ( PM): none  D ( PM): meat, starch and vegetable meal OR another sandwich or burger OR fried chicken and slaw. 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   Nutritional Diagnosis:  NI-1.5 Excessive energy intake As related to activity level.  As evidenced by BMI of 43.1    Intervention:  Nutrition counseling provided.  Discussed diabetes disease process and treatment options.  Discussed physiology of diabetes and role of obesity on insulin resistance.  Encouraged moderate weight reduction to improve glucose levels.  Discussed role of medications and diet in glucose control  Provided education on macronutrients on glucose levels.  Provided education on carb counting, importance of regularly scheduled meals/snacks, and meal planning  Discussed effects of physical activity on glucose levels and long-term glucose control.  Recommended 150 minutes  of physical activity/week.  Reviewed patient medications.  Discussed role of medication on blood glucose and possible side effects  Discussed blood glucose monitoring and interpretation.  Discussed recommended target ranges and individual ranges.    Described short-term complications: hyper- and hypo-glycemia.  Discussed causes,symptoms, and treatment options. Plan to discuss at next visit:  Discussed prevention, detection, and treatment of long-term complications.  Discussed the role of prolonged elevated glucose levels on body systems.  Discussed role of stress on blood glucose levels and discussed strategies to manage psychosocial issues.  Discussed recommendations for long-term diabetes self-care.  Established checklist for medical, dental, and emotional self-care. Plan:  Aim for 5 Carb Choices per meal (75 grams) +/- 1 either way  Aim for 0-2 Carbs per snack if hungry  Consider reading food labels for Total Carbohydrate of foods  Handouts given during visit include: Living Well with Diabetes Carb Counting and Food Label handouts Meal Plan Card.  Barriers to learning/adherance to lifestyle change: frequency of eating out, living alone  Diabetes self-care support plan:   [facility] support group  Continued diabetes education  Monitoring/Evaluation:  Dietary intake, exercise, reading food labels, and body weight in 4 week(s).

## 2012-10-18 NOTE — Patient Instructions (Addendum)
Plan:  Aim for 5 Carb Choices per meal (75 grams) +/- 1 either way  Aim for 0-2 Carbs per snack if hungry  Consider reading food labels for Total Carbohydrate of foods

## 2012-11-17 ENCOUNTER — Ambulatory Visit: Payer: BC Managed Care – PPO | Admitting: *Deleted

## 2013-07-25 ENCOUNTER — Encounter (HOSPITAL_BASED_OUTPATIENT_CLINIC_OR_DEPARTMENT_OTHER): Payer: Self-pay | Admitting: Emergency Medicine

## 2013-07-25 ENCOUNTER — Emergency Department (HOSPITAL_BASED_OUTPATIENT_CLINIC_OR_DEPARTMENT_OTHER)
Admission: EM | Admit: 2013-07-25 | Discharge: 2013-07-25 | Disposition: A | Discharge status: Home / Self Care | Payer: BC Managed Care – PPO | Attending: Emergency Medicine | Admitting: Emergency Medicine

## 2013-07-25 ENCOUNTER — Emergency Department (HOSPITAL_BASED_OUTPATIENT_CLINIC_OR_DEPARTMENT_OTHER): Payer: BC Managed Care – PPO

## 2013-07-25 DIAGNOSIS — Y9289 Other specified places as the place of occurrence of the external cause: Secondary | ICD-10-CM | POA: Insufficient documentation

## 2013-07-25 DIAGNOSIS — I10 Essential (primary) hypertension: Secondary | ICD-10-CM | POA: Insufficient documentation

## 2013-07-25 DIAGNOSIS — Y9301 Activity, walking, marching and hiking: Secondary | ICD-10-CM | POA: Insufficient documentation

## 2013-07-25 DIAGNOSIS — E669 Obesity, unspecified: Secondary | ICD-10-CM | POA: Insufficient documentation

## 2013-07-25 DIAGNOSIS — S79919A Unspecified injury of unspecified hip, initial encounter: Secondary | ICD-10-CM | POA: Insufficient documentation

## 2013-07-25 DIAGNOSIS — X500XXA Overexertion from strenuous movement or load, initial encounter: Secondary | ICD-10-CM | POA: Insufficient documentation

## 2013-07-25 DIAGNOSIS — M25559 Pain in unspecified hip: Secondary | ICD-10-CM

## 2013-07-25 DIAGNOSIS — E119 Type 2 diabetes mellitus without complications: Secondary | ICD-10-CM | POA: Insufficient documentation

## 2013-07-25 DIAGNOSIS — E785 Hyperlipidemia, unspecified: Secondary | ICD-10-CM | POA: Insufficient documentation

## 2013-07-25 DIAGNOSIS — F17200 Nicotine dependence, unspecified, uncomplicated: Secondary | ICD-10-CM | POA: Insufficient documentation

## 2013-07-25 DIAGNOSIS — S79929A Unspecified injury of unspecified thigh, initial encounter: Principal | ICD-10-CM

## 2013-07-25 LAB — URINALYSIS, ROUTINE W REFLEX MICROSCOPIC
GLUCOSE, UA: NEGATIVE mg/dL
HGB URINE DIPSTICK: NEGATIVE
KETONES UR: NEGATIVE mg/dL
Leukocytes, UA: NEGATIVE
Nitrite: NEGATIVE
Protein, ur: 30 mg/dL — AB
Specific Gravity, Urine: 1.035 — ABNORMAL HIGH (ref 1.005–1.030)
Urobilinogen, UA: 1 mg/dL (ref 0.0–1.0)
pH: 5.5 (ref 5.0–8.0)

## 2013-07-25 LAB — URINE MICROSCOPIC-ADD ON

## 2013-07-25 MED ORDER — HYDROCODONE-ACETAMINOPHEN 5-325 MG PO TABS
1.0000 | ORAL_TABLET | Freq: Once | ORAL | Status: AC
  Administered 2013-07-25: 1 via ORAL
  Filled 2013-07-25: qty 1

## 2013-07-25 MED ORDER — HYDROMORPHONE HCL PF 1 MG/ML IJ SOLN
1.0000 mg | Freq: Once | INTRAMUSCULAR | Status: AC
  Administered 2013-07-25: 1 mg via INTRAMUSCULAR
  Filled 2013-07-25: qty 1

## 2013-07-25 MED ORDER — METHYLPREDNISOLONE (PAK) 4 MG PO TABS: ORAL_TABLET | ORAL | Status: DC

## 2013-07-25 MED ORDER — OXYCODONE-ACETAMINOPHEN 5-325 MG PO TABS: 2.0000 | ORAL_TABLET | ORAL | Status: DC | PRN

## 2013-07-25 NOTE — Discharge Instructions (Signed)
Arthralgia Follow up with your doctor regarding your blood pressure.  Follow up with the orthopedic doctor. Return to the ED if you develop new or worsening symptoms. Your caregiver has diagnosed you as suffering from an arthralgia. Arthralgia means there is pain in a joint. This can come from many reasons including:  Bruising the joint which causes soreness (inflammation) in the joint.  Wear and tear on the joints which occur as we grow older (osteoarthritis).  Overusing the joint.  Various forms of arthritis.  Infections of the joint. Regardless of the cause of pain in your joint, most of these different pains respond to anti-inflammatory drugs and rest. The exception to this is when a joint is infected, and these cases are treated with antibiotics, if it is a bacterial infection. HOME CARE INSTRUCTIONS   Rest the injured area for as long as directed by your caregiver. Then slowly start using the joint as directed by your caregiver and as the pain allows. Crutches as directed may be useful if the ankles, knees or hips are involved. If the knee was splinted or casted, continue use and care as directed. If an stretchy or elastic wrapping bandage has been applied today, it should be removed and re-applied every 3 to 4 hours. It should not be applied tightly, but firmly enough to keep swelling down. Watch toes and feet for swelling, bluish discoloration, coldness, numbness or excessive pain. If any of these problems (symptoms) occur, remove the ace bandage and re-apply more loosely. If these symptoms persist, contact your caregiver or return to this location.  For the first 24 hours, keep the injured extremity elevated on pillows while lying down.  Apply ice for 15-20 minutes to the sore joint every couple hours while awake for the first half day. Then 03-04 times per day for the first 48 hours. Put the ice in a plastic bag and place a towel between the bag of ice and your skin.  Wear any  splinting, casting, elastic bandage applications, or slings as instructed.  Only take over-the-counter or prescription medicines for pain, discomfort, or fever as directed by your caregiver. Do not use aspirin immediately after the injury unless instructed by your physician. Aspirin can cause increased bleeding and bruising of the tissues.  If you were given crutches, continue to use them as instructed and do not resume weight bearing on the sore joint until instructed. Persistent pain and inability to use the sore joint as directed for more than 2 to 3 days are warning signs indicating that you should see a caregiver for a follow-up visit as soon as possible. Initially, a hairline fracture (break in bone) may not be evident on X-rays. Persistent pain and swelling indicate that further evaluation, non-weight bearing or use of the joint (use of crutches or slings as instructed), or further X-rays are indicated. X-rays may sometimes not show a small fracture until a week or 10 days later. Make a follow-up appointment with your own caregiver or one to whom we have referred you. A radiologist (specialist in reading X-rays) may read your X-rays. Make sure you know how you are to obtain your X-ray results. Do not assume everything is normal if you do not hear from us. SEEK MEDICAL CARE IF: Bruising, swelling, or pain increases. SEEK IMMEDIATE MEDICAL CARE IF:   Your fingers or toes are numb or blue.  The pain is not responding to medications and continues to stay the same or get worse.  The pain in your  joint becomes severe.  You develop a fever over 102 F (38.9 C).  It becomes impossible to move or use the joint. MAKE SURE YOU:   Understand these instructions.  Will watch your condition.  Will get help right away if you are not doing well or get worse. Document Released: 02/16/2005 Document Revised: 05/11/2011 Document Reviewed: 10/05/2007 [facility] Patient Information 2014 [place],  [place].

## 2013-07-25 NOTE — ED Notes (Signed)
Walking, felt a pop in his right hip and now has severe pain.

## 2013-07-25 NOTE — ED Notes (Signed)
Pt  Has not take prescribed medication since Dec.  Discussed HTN, DM and cholesterol ,anagement and importance of following up with PMD.  Pt verbalizes understanding.

## 2013-07-25 NOTE — ED Provider Notes (Signed)
CSN: [id number]     Arrival date & time 07/25/13  1102 History  This chart was scribed for Glynn Octave, MD by Leone Payor, ED Scribe. This patient was seen in room MH11/MH11 and the patient's care was started 12:43 PM.    Chief Complaint  Patient presents with  . Hip Pain      The history is provided by the patient. No language interpreter was used.    HPI Comments: Casey Clark is a 58 y.o. male who presents to the Emergency Department complaining of sudden onset, constant right hip pain that began PTA while walking. He states he felt a pop and then experienced shooting pain down the lateral aspect of the RLE. He states he had to stand still before he could continue walking again. He states the pain is worse with ambulation. He also reports associated paresthesias to the right great toe and right 2nd toe. He denies similar symptoms in the past. He has taken OTC pain medication without relief. He denies history of right hip injuries or surgeries. He denies back pain, chest pain, SOB, testicular pain, bowel or bladder incontinence.   Past Medical History  Diagnosis Date  . Diabetes mellitus without complication   . Hypertension   . Hyperlipidemia   . Obesity    Past Surgical History  Procedure Laterality Date  . Knee surgery     Family History  Problem Relation Age of Onset  . Heart disease Father   . Cancer Neg Hx   . Diabetes Neg Hx   . Stroke Neg Hx    History  Substance Use Topics  . Smoking status: Current Every Day Smoker -- 1.00 packs/day for 15 years  . Smokeless tobacco: Never Used  . Alcohol Use: 16.8 oz/week    28 Cans of beer per week    Review of Systems  A complete 10 system review of systems was obtained and all systems are negative except as noted in the HPI and PMH.    Allergies  Review of patient's allergies indicates no known allergies.  Home Medications   Prior to Admission medications   Medication Sig Start Date End Date Taking? Authorizing  Provider  hydrochlorothiazide (HYDRODIURIL) 25 MG tablet Take 1 tablet (25 mg total) by mouth daily. 09/13/12   Nicki Reaper, NP  metFORMIN (GLUCOPHAGE) 500 MG tablet Take 1 tablet (500 mg total) by mouth daily with breakfast. 09/13/12   Nicki Reaper, NP  simvastatin (ZOCOR) 20 MG tablet Take 1 tablet (20 mg total) by mouth at bedtime. 09/13/12   Nicki Reaper, NP   BP 181/113  Pulse 78  Temp(Src) 98.4 F (36.9 C)  Resp 18  Ht 6' (1.829 m)  Wt 300 lb (136.079 kg)  BMI 40.68 kg/m2  SpO2 98% Physical Exam  Nursing note and vitals reviewed. Constitutional: He is oriented to person, place, and time. He appears well-developed and well-nourished.  HENT:  Head: Normocephalic and atraumatic.  Cardiovascular: Normal rate.   Pulmonary/Chest: Effort normal.  Abdominal: Soft. He exhibits no distension. There is no tenderness. There is no rebound and no guarding.  Genitourinary:  No testicular pain  Musculoskeletal: Normal range of motion.  Tenderness to the right lateral hip. Full ROM without pain. No lumbar spine tenderness. 5/5 strength in bilateral lower extremities. Ankle plantar and dorsiflexion intact. Great toe extension intact bilaterally. +2 DP and PT pulses. +2 patellar reflexes bilaterally. Normal gait.   Neurological: He is alert and oriented to person, place, and time.  Skin: Skin is warm and dry.  Psychiatric: He has a normal mood and affect.    ED Course  Procedures (including critical care time)  DIAGNOSTIC STUDIES: Oxygen Saturation is 97% on RA, adequate by my interpretation.    COORDINATION OF CARE: 12:47 PM Discussed treatment plan with pt at bedside and pt agreed to plan.   Labs Review Labs Reviewed  URINALYSIS, ROUTINE W REFLEX MICROSCOPIC - Abnormal; Notable for the following:    Color, Urine AMBER (*)    Specific Gravity, Urine 1.035 (*)    Bilirubin Urine SMALL (*)    Protein, ur 30 (*)    All other components within normal limits  URINE MICROSCOPIC-ADD ON -  Abnormal; Notable for the following:    Casts HYALINE CASTS (*)    Crystals CA OXALATE CRYSTALS (*)    All other components within normal limits    Imaging Review Dg Lumbar Spine Complete  07/25/2013   CLINICAL DATA:  Low back pain  EXAM: LUMBAR SPINE - COMPLETE 4+ VIEW  COMPARISON:  None.  FINDINGS: Five lumbar type vertebral bodies are well visualized. Vertebral body height is well maintained. Disc space narrowing is noted at L4-5. Mild osteophytic changes are noted. No spondylolisthesis is seen.  IMPRESSION: Mild degenerative change without acute abnormality.   Electronically Signed   By: Alcide CleverMark  Lukens M.D.   On: 07/25/2013 13:12   Dg Hip Complete Right  07/25/2013   CLINICAL DATA:  Right hip pain for 3 days  EXAM: RIGHT HIP - COMPLETE 2+ VIEW  COMPARISON:  None.  FINDINGS: Three views of the right hip submitted. No acute fracture or subluxation. Mild degenerative changes with narrowing of superior joint space. Mild bilateral acetabular spurring. Mild degenerative changes pubic symphysis.  IMPRESSION: No acute fracture or subluxation. Degenerative changes as described above.   Electronically Signed   By: Natasha MeadLiviu  Pop M.D.   On: 07/25/2013 11:39   Ct Hip Right Wo Contrast  07/25/2013   CLINICAL DATA:  Right lateral hip pain  EXAM: CT OF THE RIGHT HIP WITHOUT CONTRAST  TECHNIQUE: Multidetector CT imaging was performed according to the standard protocol. Multiplanar CT image reconstructions were also generated.  COMPARISON:  None.  FINDINGS: There is no fracture or dislocation. There is osteoarthritis of the right hip with joint space narrowing and marginal osteophytosis. There is a slightly sclerotic area in the femoral head extending to the articular surface concerning for avascular necrosis. There is no articular surface collapse. The superior inferior pubic rami are intact. The right SI joint is unremarkable.  There is degenerative disc disease at L5-S1 with bilateral moderate facet arthropathy.   There is no soft tissue mass, fluid collection or hematoma.  IMPRESSION: 1. No acute osseous injury of the right hip.  2.  Osteoarthritis of the right hip.  3. Sclerotic area in the femoral head extending to the articular surface concerning for avascular necrosis. No articular surface collapse.   Electronically Signed   By: Elige KoHetal  Patel   On: 07/25/2013 14:55     EKG Interpretation None      MDM   Final diagnoses:  Hip pain    Patient had sudden onset right hip pain while walking with a popping sensation. Denies any back pain, weakness, numbness or tingling. Pain radiates down the right side of his hip tender his foot. No incontinence.  Xray negative. He has no focal weakness, numbness or tingling. No bowel or bladder incontinence. No fever or vomiting. No evidence of cauda  equina or cord compression. No chest pain or SOB. He is able to bear weight. He is aware of his elevated blood pressure. His been noncompliant with medications for quite some time. CT confirms no fracture. Possible area of avascular necrosis.  Follow up with ortho. Will treat with pain meds and steroids. Also needs PCP followup for elevated blood pressure. Able to ambulate without assistance.         I personally performed the services described in this documentation, which was scribed in my presence. The recorded information has been reviewed and is accurate.   Glynn Octave, MD 07/25/13 [id number]

## 2013-07-26 ENCOUNTER — Encounter: Payer: Self-pay | Admitting: Family Medicine

## 2013-07-26 ENCOUNTER — Ambulatory Visit (INDEPENDENT_AMBULATORY_CARE_PROVIDER_SITE_OTHER): Payer: BC Managed Care – PPO | Admitting: Family Medicine

## 2013-07-26 VITALS — BP 178/111 | HR 69 | Ht 72.0 in | Wt 300.0 lb

## 2013-07-26 DIAGNOSIS — M545 Low back pain, unspecified: Secondary | ICD-10-CM

## 2013-07-26 MED ORDER — CYCLOBENZAPRINE HCL 10 MG PO TABS: 10.0000 mg | ORAL_TABLET | Freq: Three times a day (TID) | ORAL | Status: DC | PRN

## 2013-07-26 MED ORDER — OXYCODONE-ACETAMINOPHEN 5-325 MG PO TABS: 1.0000 | ORAL_TABLET | Freq: Four times a day (QID) | ORAL | Status: DC | PRN

## 2013-07-26 NOTE — Patient Instructions (Signed)
You have lumbar radiculopathy (a pinched nerve in your low back) or severe sciatica from a pulled muscle irritating the sciatic nerve in your hip. Both are treated the same. A prednisone dose pack is the best option for immediate relief and may be prescribed. Take this as prescribed fromt he emergency department.  Day after finishing prednisone start meloxicam 15mg  daily with food for pain and inflammation. Percocet as needed for severe pain (no driving on this medicine). Flexeril as needed for muscle spasms (no driving on this medicine if it makes you sleepy). Stay as active as possible. Physical therapy has been shown to be helpful as well - will consider if improving over the next week. Strengthening of low back muscles, abdominal musculature are key for long term pain relief. If not improving, will consider further imaging (MRI). Call me in 1 week to let me know how you're doing.

## 2013-08-01 ENCOUNTER — Encounter: Payer: Self-pay | Admitting: Family Medicine

## 2013-08-01 DIAGNOSIS — M545 Low back pain, unspecified: Secondary | ICD-10-CM | POA: Insufficient documentation

## 2013-08-01 NOTE — Progress Notes (Signed)
Patient ID: Casey Clark, male   DOB: [birth year], 58 y.o.   MRN: [id number]  PCP: Nicki Reaper, NP  Subjective:   HPI: Patient is a 58 y.o. male here for right hip, back pain.  Patient reports he was simply walking on Saturday when he heard a pop lateral hip radiating down to right foot. Unable to bear weight initially. Slight swelling but no bruising. Tried heat, ibuprofen, pain medicine from ED. Has not taken prednisone ED has given yet. Associated with tingling into this foot and toes. Has to sleep sitting up since the injury. No prior issues with this back or hip. Worse at nighttime. No bowel/bladder dysfunction. Had MRI of hip following x-rays that showed questionable focal AVN. He denies any groin pain.  Past Medical History  Diagnosis Date  . Diabetes mellitus without complication   . Hypertension   . Hyperlipidemia   . Obesity     Current Outpatient Prescriptions on File Prior to Visit  Medication Sig Dispense Refill  . hydrochlorothiazide (HYDRODIURIL) 25 MG tablet Take 1 tablet (25 mg total) by mouth daily.  90 tablet  2  . metFORMIN (GLUCOPHAGE) 500 MG tablet Take 1 tablet (500 mg total) by mouth daily with breakfast.  60 tablet  2  . methylPREDNIsolone (MEDROL DOSPACK) 4 MG tablet follow package directions  21 tablet  0  . simvastatin (ZOCOR) 20 MG tablet Take 1 tablet (20 mg total) by mouth at bedtime.  30 tablet  3   No current facility-administered medications on file prior to visit.    Past Surgical History  Procedure Laterality Date  . Knee surgery      No Known Allergies  History   Social History  . Marital Status: Single    Spouse Name: N/A    Number of Children: N/A  . Years of Education: N/A   Occupational History  . Not on file.   Social History Main Topics  . Smoking status: Current Every Day Smoker -- 1.00 packs/day for 15 years  . Smokeless tobacco: Never Used  . Alcohol Use: 16.8 oz/week    28 Cans of beer per week  . Drug Use:  No  . Sexual Activity: Not on file   Other Topics Concern  . Not on file   Social History Narrative  . No narrative on file    Family History  Problem Relation Age of Onset  . Heart disease Father   . Cancer Neg Hx   . Diabetes Neg Hx   . Stroke Neg Hx     BP 178/111  Pulse 69  Ht 6' (1.829 m)  Wt 300 lb (136.079 kg)  BMI 40.68 kg/m2  Review of Systems: See HPI above.    Objective:  Physical Exam:  Gen: NAD  Back/R hip: No gross deformity, scoliosis. TTP right lumbar paraspinal region, buttock.  No anterior tenderness.  No trochanter tenderness.  No midline or bony TTP. FROM hip without pain, negative logroll.  Pain on flexion and extension of back. Strength LEs 5/5 all muscle groups.   Trace MSRs in patellar and achilles tendons, equal bilaterally. Positive SLR on right, negative left. Sensation intact to light touch bilaterally currently. Negative fabers and piriformis stretches.    Assessment & Plan:  1. Low back injury - this is not consistent with intraarticular pathology of his hip (AVN) based on his exam, history.  He has pain low back, buttock radiating into foot with tingling - consistent with likely disc herniation vs sciatica  from piriformis or external rotator tear near sciatic nerve (less likely with negative piriformis, fabers stretches).  Start with prednisone dose pack then transition to meloxicam.  Percocet, flexeril as needed. Call in a week to let me know how he's doing - consider MRI if not improving, PT if improving.

## 2013-08-01 NOTE — Assessment & Plan Note (Signed)
this is not consistent with intraarticular pathology of his hip (AVN) based on his exam, history.  He has pain low back, buttock radiating into foot with tingling - consistent with likely disc herniation vs sciatica from piriformis or external rotator tear near sciatic nerve (less likely with negative piriformis, fabers stretches).  Start with prednisone dose pack then transition to meloxicam.  Percocet, flexeril as needed. Call in a week to let me know how he's doing - consider MRI if not improving, PT if improving.

## 2014-09-19 ENCOUNTER — Encounter: Payer: Self-pay | Admitting: Family Medicine

## 2014-09-19 ENCOUNTER — Ambulatory Visit (INDEPENDENT_AMBULATORY_CARE_PROVIDER_SITE_OTHER): Payer: Self-pay | Admitting: Family Medicine

## 2014-09-19 VITALS — BP 165/114 | HR 96 | Ht 72.0 in | Wt 254.0 lb

## 2014-09-19 DIAGNOSIS — M545 Low back pain, unspecified: Secondary | ICD-10-CM

## 2014-09-19 DIAGNOSIS — M25552 Pain in left hip: Secondary | ICD-10-CM

## 2014-09-19 MED ORDER — PREDNISONE 10 MG PO TABS: ORAL_TABLET | ORAL | Status: AC

## 2014-09-19 MED ORDER — OXYCODONE-ACETAMINOPHEN 5-325 MG PO TABS: 1.0000 | ORAL_TABLET | Freq: Four times a day (QID) | ORAL | Status: DC | PRN

## 2014-09-19 NOTE — Patient Instructions (Addendum)
Your exam is consistent with overuse strain of hip rotators. A prednisone dose pack is the best option for immediate relief and may be prescribed.  Day after finishing prednisone start aleve 2 tabs twice a day with food for pain and inflammation. Percocet as needed for severe pain (no driving on this medicine). Stay as active as possible. Side leg raises and standing hip rotations 3 sets of 10 once a day. Pick 2-3 of the stretches, hold for 20-30 seconds x 3 once a day (no more than twice a day). Follow up with me in 1 month.

## 2014-09-24 DIAGNOSIS — M25552 Pain in left hip: Secondary | ICD-10-CM | POA: Insufficient documentation

## 2014-09-24 NOTE — Progress Notes (Signed)
Patient ID: Casey Clark, male   DOB: Jul 13, 1955, 59 y.o.   MRN: [id number]  PCP: Nicki Reaper, NP  Subjective:   HPI: Patient is a 59 y.o. male here for left hip, back pain.  07/26/13: Patient reports he was simply walking on Saturday when he heard a pop lateral hip radiating down to right foot. Unable to bear weight initially. Slight swelling but no bruising. Tried heat, ibuprofen, pain medicine from ED. Has not taken prednisone ED has given yet. Associated with tingling into this foot and toes. Has to sleep sitting up since the injury. No prior issues with this back or hip. Worse at nighttime. No bowel/bladder dysfunction. Had MRI of hip following x-rays that showed questionable focal AVN. He denies any groin pain.  09/19/14: Patient now having pain more in left hip for the past week. No change in activity level and no injuries. Painful to walk, better with sitting. No grinding, popping. Tried ibuprofen. No radiation into the leg. No numbness or tingling. Pain is posterior. No bowel/bladder dysfunction.  Past Medical History  Diagnosis Date  . Diabetes mellitus without complication   . Hypertension   . Hyperlipidemia   . Obesity     Current Outpatient Prescriptions on File Prior to Visit  Medication Sig Dispense Refill  . hydrochlorothiazide (HYDRODIURIL) 25 MG tablet Take 1 tablet (25 mg total) by mouth daily. 90 tablet 2  . metFORMIN (GLUCOPHAGE) 500 MG tablet Take 1 tablet (500 mg total) by mouth daily with breakfast. 60 tablet 2  . simvastatin (ZOCOR) 20 MG tablet Take 1 tablet (20 mg total) by mouth at bedtime. 30 tablet 3   No current facility-administered medications on file prior to visit.    Past Surgical History  Procedure Laterality Date  . Knee surgery      No Known Allergies  History   Social History  . Marital Status: Single    Spouse Name: N/A  . Number of Children: N/A  . Years of Education: N/A   Occupational History  . Not on  file.   Social History Main Topics  . Smoking status: Current Every Day Smoker -- 1.00 packs/day for 15 years  . Smokeless tobacco: Never Used  . Alcohol Use: 16.8 oz/week    28 Cans of beer per week  . Drug Use: No  . Sexual Activity: Not on file   Other Topics Concern  . Not on file   Social History Narrative    Family History  Problem Relation Age of Onset  . Heart disease Father   . Cancer Neg Hx   . Diabetes Neg Hx   . Stroke Neg Hx     BP 165/114 mmHg  Pulse 96  Ht 6' (1.829 m)  Wt 254 lb (115.214 kg)  BMI 34.44 kg/m2  Review of Systems: See HPI above.    Objective:  Physical Exam:  Gen: NAD  Back/R hip: No gross deformity, scoliosis. TTP left buttock deep in external rotators.  No lumbar paraspinal, anterior hip, trochanter, other tenderness. FROM hip without pain, negative logroll.   FROM back without pain. Strength LEs 5/5 all muscle groups except 5-/5 hip abduction, external rotation.   Trace MSRs in patellar and achilles tendons, equal bilaterally. Negative SLRs bilaterally. Sensation intact to light touch bilaterally currently. Negative fabers and piriformis stretches.    Assessment & Plan:  1. Left hip pain - different from prior back/hip pain.  2/2 overuse of hip external rotators, abductors.  Start with prednisone dose  pack and transition to aleve.  Percocet as needed for severe pain.  Shown home exercises and stretches to do daily.  F/u in 1 month.  Consider physical therapy if not improving.

## 2014-09-24 NOTE — Assessment & Plan Note (Signed)
different from prior back/hip pain.  2/2 overuse of hip external rotators, abductors.  Start with prednisone dose pack and transition to aleve.  Percocet as needed for severe pain.  Shown home exercises and stretches to do daily.  F/u in 1 month.  Consider physical therapy if not improving.

## 2014-09-26 ENCOUNTER — Encounter: Payer: Self-pay | Admitting: Family Medicine

## 2014-09-26 ENCOUNTER — Ambulatory Visit (INDEPENDENT_AMBULATORY_CARE_PROVIDER_SITE_OTHER): Payer: BLUE CROSS/BLUE SHIELD | Admitting: Family Medicine

## 2014-09-26 VITALS — BP 149/94 | HR 105 | Ht 72.0 in | Wt 254.0 lb

## 2014-09-26 DIAGNOSIS — M5126 Other intervertebral disc displacement, lumbar region: Secondary | ICD-10-CM

## 2014-09-26 DIAGNOSIS — M25552 Pain in left hip: Secondary | ICD-10-CM | POA: Diagnosis not present

## 2014-09-26 DIAGNOSIS — M5442 Lumbago with sciatica, left side: Secondary | ICD-10-CM

## 2014-09-27 NOTE — Progress Notes (Addendum)
Patient ID: Casey Clark, male   DOB: Oct 16, 1955, 59 y.o.   MRN: [id number]  PCP: Nicki Reaper, NP  Subjective:   HPI: Patient is a 59 y.o. male here for left hip, back pain.  07/26/13: Patient reports he was simply walking on Saturday when he heard a pop lateral hip radiating down to right foot. Unable to bear weight initially. Slight swelling but no bruising. Tried heat, ibuprofen, pain medicine from ED. Has not taken prednisone ED has given yet. Associated with tingling into this foot and toes. Has to sleep sitting up since the injury. No prior issues with this back or hip. Worse at nighttime. No bowel/bladder dysfunction. Had MRI of hip following x-rays that showed questionable focal AVN. He denies any groin pain.  09/19/14: Patient now having pain more in left hip for the past week. No change in activity level and no injuries. Painful to walk, better with sitting. No grinding, popping. Tried ibuprofen. No radiation into the leg. No numbness or tingling. Pain is posterior. No bowel/bladder dysfunction.  7/27: Patient reports he hasn't improved since last visit. Taking prednisone, pain medication. Painful walking long time. No numbness/tingling. Pain into left hip.  Past Medical History  Diagnosis Date  . Diabetes mellitus without complication   . Hypertension   . Hyperlipidemia   . Obesity     Current Outpatient Prescriptions on File Prior to Visit  Medication Sig Dispense Refill  . hydrochlorothiazide (HYDRODIURIL) 25 MG tablet Take 1 tablet (25 mg total) by mouth daily. 90 tablet 2  . metFORMIN (GLUCOPHAGE) 500 MG tablet Take 1 tablet (500 mg total) by mouth daily with breakfast. 60 tablet 2  . oxyCODONE-acetaminophen (PERCOCET/ROXICET) 5-325 MG per tablet Take 1 tablet by mouth every 6 (six) hours as needed for severe pain. 60 tablet 0  . predniSONE (DELTASONE) 10 MG tablet 6 tabs po day 1, 5 tabs po day 2, 4 tabs po day 3, 3 tabs po day 4, 2 tabs po day 5,  1 tab po day 6 21 tablet 0  . simvastatin (ZOCOR) 20 MG tablet Take 1 tablet (20 mg total) by mouth at bedtime. 30 tablet 3   No current facility-administered medications on file prior to visit.    Past Surgical History  Procedure Laterality Date  . Knee surgery      No Known Allergies  History   Social History  . Marital Status: Single    Spouse Name: N/A  . Number of Children: N/A  . Years of Education: N/A   Occupational History  . Not on file.   Social History Main Topics  . Smoking status: Never Smoker   . Smokeless tobacco: Current User  . Alcohol Use: 16.8 oz/week    28 Cans of beer per week  . Drug Use: No  . Sexual Activity: Not on file   Other Topics Concern  . Not on file   Social History Narrative    Family History  Problem Relation Age of Onset  . Heart disease Father   . Cancer Neg Hx   . Diabetes Neg Hx   . Stroke Neg Hx     BP 149/94 mmHg  Pulse 105  Ht 6' (1.829 m)  Wt 254 lb (115.214 kg)  BMI 34.44 kg/m2  Review of Systems: See HPI above.    Objective:  Physical Exam:  Gen: NAD  Back/R hip: No gross deformity, scoliosis. TTP left lumbar paraspinal region and left buttock deep in external rotators.  No  other tenderness. FROM hip without pain, negative logroll.   FROM back with mild pain on extension. Strength LEs 5/5 all muscle groups except 5-/5 hip abduction, external rotation.   Trace MSRs in patellar and achilles tendons, equal bilaterally. Negative SLRs bilaterally. Sensation intact to light touch bilaterally currently. Negative fabers and piriformis stretches.    Assessment & Plan:  1. Left hip/back pain - Not improving with prednisone, aleve, percocet, home exercises and level of pain is significant.  Concerning for disc herniation masking as hip external rotator/abductor strain.  Will go ahead with MRI to further assess.  Addendum:  MRI reviewed and discussed with patient.  Based on his symptoms along with the MRI  suspect the combination of disc protrusion and facet hypertrophy to the left at L5-S1 is most likely cause of his symptoms.  Will go ahead with ESI vs facet injection (defer to IR) - advised patient to call us 1-2 weeks after injection to let us know how he's doing.

## 2014-09-27 NOTE — Assessment & Plan Note (Signed)
Not improving with prednisone, aleve, percocet, home exercises and level of pain is significant.  Concerning for disc herniation masking as hip external rotator/abductor strain.  Will go ahead with MRI to further assess.

## 2014-09-28 ENCOUNTER — Other Ambulatory Visit: Payer: Self-pay | Admitting: Family Medicine

## 2014-09-28 DIAGNOSIS — M5126 Other intervertebral disc displacement, lumbar region: Secondary | ICD-10-CM

## 2014-09-29 ENCOUNTER — Ambulatory Visit (HOSPITAL_BASED_OUTPATIENT_CLINIC_OR_DEPARTMENT_OTHER): Payer: BLUE CROSS/BLUE SHIELD

## 2014-09-29 ENCOUNTER — Ambulatory Visit (HOSPITAL_BASED_OUTPATIENT_CLINIC_OR_DEPARTMENT_OTHER)
Admission: RE | Admit: 2014-09-29 | Discharge: 2014-09-29 | Disposition: A | Discharge status: Home / Self Care | Payer: BLUE CROSS/BLUE SHIELD | Source: Ambulatory Visit | Attending: Family Medicine | Admitting: Family Medicine

## 2014-09-29 DIAGNOSIS — M4807 Spinal stenosis, lumbosacral region: Secondary | ICD-10-CM | POA: Insufficient documentation

## 2014-09-29 DIAGNOSIS — M545 Low back pain: Secondary | ICD-10-CM | POA: Diagnosis present

## 2014-09-29 DIAGNOSIS — M5126 Other intervertebral disc displacement, lumbar region: Secondary | ICD-10-CM | POA: Diagnosis not present

## 2014-10-03 ENCOUNTER — Other Ambulatory Visit: Payer: Self-pay | Admitting: Family Medicine

## 2014-10-03 ENCOUNTER — Telehealth: Payer: Self-pay | Admitting: Family Medicine

## 2014-10-03 DIAGNOSIS — M5416 Radiculopathy, lumbar region: Secondary | ICD-10-CM

## 2014-10-03 MED ORDER — HYDROCODONE-ACETAMINOPHEN 5-325 MG PO TABS: 1.0000 | ORAL_TABLET | Freq: Four times a day (QID) | ORAL | Status: AC | PRN

## 2014-10-03 NOTE — Telephone Encounter (Signed)
Will have to step down to [place] from Percocet - he will have to pick up the prescription.

## 2014-10-03 NOTE — Telephone Encounter (Signed)
finished

## 2014-10-08 ENCOUNTER — Ambulatory Visit
Admission: RE | Admit: 2014-10-08 | Discharge: 2014-10-08 | Disposition: A | Discharge status: Home / Self Care | Payer: BLUE CROSS/BLUE SHIELD | Source: Ambulatory Visit | Attending: Family Medicine | Admitting: Family Medicine

## 2014-10-08 DIAGNOSIS — M5416 Radiculopathy, lumbar region: Secondary | ICD-10-CM

## 2014-10-08 MED ORDER — METHYLPREDNISOLONE ACETATE 40 MG/ML INJ SUSP (RADIOLOG
120.0000 mg | Freq: Once | INTRAMUSCULAR | Status: AC
  Administered 2014-10-08: 120 mg via EPIDURAL

## 2014-10-08 MED ORDER — IOHEXOL 180 MG/ML  SOLN
1.0000 mL | Freq: Once | INTRAMUSCULAR | Status: DC | PRN
  Administered 2014-10-08: 1 mL via EPIDURAL

## 2014-10-08 NOTE — Discharge Instructions (Signed)

## 2014-10-17 ENCOUNTER — Telehealth: Payer: Self-pay | Admitting: Family Medicine

## 2014-10-17 NOTE — Addendum Note (Signed)
Addended by: Kathi Simpers F on: 10/17/2014 04:54 PM   Modules accepted: Orders

## 2014-10-17 NOTE — Telephone Encounter (Signed)
Spoke to patient and he is going to try physical therapy first. Will refer to PT.

## 2014-10-17 NOTE — Telephone Encounter (Signed)
That's disappointing.  If he's still in quite a bit of pain I think we need to have him consult with neurosurgery.  If he's a little better and can tolerate it I would recommend trying physical therapy.

## 2014-10-22 ENCOUNTER — Ambulatory Visit: Payer: Self-pay | Admitting: Family Medicine

## 2014-10-24 ENCOUNTER — Telehealth: Payer: Self-pay | Admitting: Family Medicine

## 2014-10-25 ENCOUNTER — Other Ambulatory Visit: Payer: Self-pay | Admitting: *Deleted

## 2014-10-25 MED ORDER — TRAMADOL HCL 50 MG PO TABS: ORAL_TABLET | ORAL | Status: AC

## 2014-10-25 NOTE — Telephone Encounter (Signed)
Spoke to patient and he wants to do the Tramadol. Will pick up from [facility] on 10-26-2014.

## 2014-10-25 NOTE — Telephone Encounter (Signed)
According to PT they have called him twice and have not received a call back.  Cannot refill the oxycodone or hydrocodone.  Can have a one time prescription for tramadol  every 8 hours as needed for severe pain, 90 with 0 refills.  Must use other non-narcotic measures for pain otherwise and moving forward (nsaids, topical medications, tylenol).

## 2014-11-19 ENCOUNTER — Telehealth: Payer: Self-pay | Admitting: Family Medicine

## 2014-11-19 NOTE — Telephone Encounter (Signed)
I think he means a neurosurgeon for his back pain.  Ok to get him in with one.  Thanks!

## 2014-11-23 ENCOUNTER — Other Ambulatory Visit: Payer: Self-pay | Admitting: Family Medicine

## 2014-11-23 DIAGNOSIS — G8929 Other chronic pain: Secondary | ICD-10-CM

## 2014-11-23 DIAGNOSIS — M545 Low back pain: Principal | ICD-10-CM

## 2014-12-06 ENCOUNTER — Other Ambulatory Visit: Payer: Self-pay | Admitting: Family Medicine

## 2014-12-06 ENCOUNTER — Ambulatory Visit
Admission: RE | Admit: 2014-12-06 | Discharge: 2014-12-06 | Disposition: A | Discharge status: Home / Self Care | Payer: BLUE CROSS/BLUE SHIELD | Source: Ambulatory Visit | Attending: Family Medicine | Admitting: Family Medicine

## 2014-12-06 DIAGNOSIS — G8929 Other chronic pain: Secondary | ICD-10-CM

## 2014-12-06 DIAGNOSIS — M545 Low back pain: Principal | ICD-10-CM

## 2014-12-06 MED ORDER — IOHEXOL 180 MG/ML  SOLN
1.0000 mL | Freq: Once | INTRAMUSCULAR | Status: DC | PRN
  Administered 2014-12-06: 1 mL via EPIDURAL

## 2014-12-06 MED ORDER — METHYLPREDNISOLONE ACETATE 40 MG/ML INJ SUSP (RADIOLOG
120.0000 mg | Freq: Once | INTRAMUSCULAR | Status: AC
  Administered 2014-12-06: 120 mg via EPIDURAL

## 2017-08-05 ENCOUNTER — Emergency Department (HOSPITAL_COMMUNITY)
Admission: EM | Admit: 2017-08-05 | Discharge: 2017-08-30 | Disposition: E | Payer: BLUE CROSS/BLUE SHIELD | Attending: Emergency Medicine | Admitting: Emergency Medicine

## 2017-08-05 ENCOUNTER — Encounter (HOSPITAL_COMMUNITY): Payer: Self-pay | Admitting: *Deleted

## 2017-08-05 ENCOUNTER — Telehealth: Payer: Self-pay | Admitting: Internal Medicine

## 2017-08-05 DIAGNOSIS — Z7984 Long term (current) use of oral hypoglycemic drugs: Secondary | ICD-10-CM | POA: Insufficient documentation

## 2017-08-05 DIAGNOSIS — I10 Essential (primary) hypertension: Secondary | ICD-10-CM | POA: Insufficient documentation

## 2017-08-05 DIAGNOSIS — Z79899 Other long term (current) drug therapy: Secondary | ICD-10-CM | POA: Insufficient documentation

## 2017-08-05 DIAGNOSIS — E119 Type 2 diabetes mellitus without complications: Secondary | ICD-10-CM | POA: Insufficient documentation

## 2017-08-05 DIAGNOSIS — I469 Cardiac arrest, cause unspecified: Secondary | ICD-10-CM | POA: Insufficient documentation

## 2017-08-05 LAB — CBC
HEMATOCRIT: 46.2 % (ref 39.0–52.0)
HEMOGLOBIN: 14 g/dL (ref 13.0–17.0)
MCH: 30.7 pg (ref 26.0–34.0)
MCHC: 30.3 g/dL (ref 30.0–36.0)
MCV: 101.3 fL — AB (ref 78.0–100.0)
Platelets: 195 10*3/uL (ref 150–400)
RBC: 4.56 MIL/uL (ref 4.22–5.81)
RDW: 12.2 % (ref 11.5–15.5)
WBC: 7.8 10*3/uL (ref 4.0–10.5)

## 2017-08-05 LAB — I-STAT CG4 LACTIC ACID, ED: LACTIC ACID, VENOUS: 13.24 mmol/L — AB (ref 0.5–1.9)

## 2017-08-05 LAB — COMPREHENSIVE METABOLIC PANEL
ALK PHOS: 76 U/L (ref 38–126)
ALT: 80 U/L — AB (ref 17–63)
ANION GAP: 20 — AB (ref 5–15)
AST: 62 U/L — ABNORMAL HIGH (ref 15–41)
Albumin: 3.2 g/dL — ABNORMAL LOW (ref 3.5–5.0)
BILIRUBIN TOTAL: 1.1 mg/dL (ref 0.3–1.2)
BUN: 19 mg/dL (ref 6–20)
CO2: 13 mmol/L — AB (ref 22–32)
CREATININE: 1.53 mg/dL — AB (ref 0.61–1.24)
Calcium: 8.6 mg/dL — ABNORMAL LOW (ref 8.9–10.3)
Chloride: 103 mmol/L (ref 101–111)
GFR calc Af Amer: 55 mL/min — ABNORMAL LOW (ref >60)
GFR, EST NON AFRICAN AMERICAN: 48 mL/min — AB (ref >60)
Glucose, Bld: 651 mg/dL (ref 65–99)
Potassium: 4.4 mmol/L (ref 3.5–5.1)
SODIUM: 136 mmol/L (ref 135–145)
TOTAL PROTEIN: 5.2 g/dL — AB (ref 6.5–8.1)

## 2017-08-05 LAB — CBG MONITORING, ED: GLUCOSE-CAPILLARY: 579 mg/dL — AB (ref 65–99)

## 2017-08-05 LAB — I-STAT TROPONIN, ED: TROPONIN I, POC: 0.22 ng/mL — AB (ref 0.00–0.08)

## 2017-08-05 MED ORDER — CALCIUM CHLORIDE 10 % IV SOLN
INTRAVENOUS | Status: AC | PRN
  Administered 2017-08-05: 1 g via INTRAVENOUS

## 2017-08-05 MED ORDER — EPINEPHRINE PF 1 MG/10ML IJ SOSY
PREFILLED_SYRINGE | INTRAMUSCULAR | Status: AC | PRN
  Administered 2017-08-05 (×3): 1 via INTRAVENOUS

## 2017-08-05 MED ORDER — SODIUM BICARBONATE 8.4 % IV SOLN
INTRAVENOUS | Status: AC | PRN
  Administered 2017-08-05: 50 meq via INTRAVENOUS

## 2017-08-05 MED ORDER — MAGNESIUM SULFATE 50 % IJ SOLN
INTRAMUSCULAR | Status: AC | PRN
  Administered 2017-08-05: 2 g via INTRAVENOUS

## 2017-08-06 ENCOUNTER — Telehealth: Payer: Self-pay

## 2017-08-06 NOTE — Telephone Encounter (Signed)
Noted  

## 2017-08-06 NOTE — Telephone Encounter (Signed)
PLEASE NOTE: All timestamps contained within this report are represented as [place] Standard Time. CONFIDENTIALTY NOTICE: This fax transmission is intended only for the addressee. It contains information that is legally privileged, confidential or otherwise protected from use or disclosure. If you are not the intended recipient, you are strictly prohibited from reviewing, disclosing, copying using or disseminating any of this information or taking any action in reliance on or regarding this information. If you have received this fax in error, please notify us immediately by telephone so that we can arrange for its return to us. Phone: [phone], Toll-Free: [phone], Fax: [phone] Page: 1 of 1 Call Id: [id number] [facility] - Client Nonclinical Telephone Record [facility] Client [facility] - Client Client [facility] - Night Physician Nicki ReaperBaity, Regina - NP Contact Type Call Who Is Calling Physician / Provider / Hospital Call Type Provider Call [facility] Page Now Reason for Call Request to speak to Physician Initial Comment [facility] patient deceased Additional Comment Patient Name Samson FredericJay Cafaro Patient DOB 1955-06-[address] Provider Dr Anitra LauthPlunkett Physician Number [phone] [facility] Paging DoctorName Phone DateTime Result/Outcome Message Type Notes McClean-Scocuzza, French Anaracy [phone] 08/18/2017 8:54:32 PM Called On Call Provider - Reached Doctor Paged McClean-Scocuzza, French Anaracy 08/26/2017 8:54:53 PM Spoke with On Call - General Message Result Call Closed By: Ishmael HolterJamie Anderson Transaction Date/Time: 08/14/2017 8:23:35 PM (ET)

## 2017-08-06 NOTE — Telephone Encounter (Signed)
Please see phone note on March 28, 2017.

## 2017-08-30 NOTE — ED Notes (Signed)
Casey Clark [phone](son)) and [name] [phone] (daughter)), next of kin  Funeral home picked out - [facility] [place] [phone])

## 2017-08-30 NOTE — Code Documentation (Signed)
Bedside US completed by Dr.Plunkett and Dr.Tucker; Time of Death 1947

## 2017-08-30 NOTE — Telephone Encounter (Signed)
Called by on call RN per Dr. Dr. Anitra LauthPlunkett ED physician   [facility] and was seen for HR in 30s today but per review of chart he has not been since by her since 09/13/2012 and discussed this with Dr. Anitra LauthPlunkett tonight.   It appears he had arrhythmia and sob and he was pronounced deceased in the ED  TMS

## 2017-08-30 NOTE — ED Notes (Signed)
Paged Dr. Sampson SiBaity office to [phone]

## 2017-08-30 NOTE — Code Documentation (Signed)
[facility]; Family reported pt was at [employer], had a questionable seizure -like episode and what appeared to be choking, then syncope. Family immediately started CPR, EMS found food in the airway that was removed for intubation. AED shocked pt x3, EMS gave additional shock for vfib and epi x6. Pt went into asystole after 4th shock then into vfib. Cap 39. Per family, pt was at his PCP today for a heart rate in the 30s. CPR in process with LUCAS on arrival

## 2017-08-30 NOTE — ED Provider Notes (Signed)
[facility] Provider Note   CSN: [id number] Arrival date & time: 07/31/2017  1933     History   Chief Complaint Chief Complaint  Patient presents with  . Cardiac Arrest    HPI Casey Clark is a 62 y.o. male.  Patient is a 62 year old male with a history of diabetes, hypertension, hyperlipidemia who came in by EMS as a cardiac arrest.  Per family patient had gone out to eat with them at [employer] and had said that he had not been feeling well over the last few days and was short of breath last night.  Apparently patient had gone to an urgent care today and was told that he had a heart rate in the 30s and he told family that they wanted to do a lot of testing but sent him home.  Patient was eating at [employer] when suddenly he slumped over started vomiting and having sonorous respirations.  Family states his face immediately turned purple and CPR was started at the scene.  When fire arrived AED fired 3 times without return of spontaneous circulation.  When EMS arrived they shocked 3 additional times as patient was in ventricular fibrillation and intubated him with an 8 oh tube.  He did have food in his airway which was easily suctioned.  Patient has received 6 rounds of epi, 450 mg of amiodarone without return of circulation.  Patient's capnography's are approximately mid 30s and blood sugar is greater than 500.  The history is provided by the EMS personnel and a relative. The history is limited by the condition of the patient.    Past Medical History:  Diagnosis Date  . Diabetes mellitus without complication (HCC)   . Hyperlipidemia   . Hypertension   . Obesity     Patient Active Problem List   Diagnosis Date Noted  . Left hip pain 09/24/2014  . Low back pain 08/01/2013  . Unspecified essential hypertension 05/26/2012  . Diabetes mellitus type 2 in obese (HCC) 05/26/2012  . Other and unspecified hyperlipidemia 05/26/2012    Past  Surgical History:  Procedure Laterality Date  . KNEE SURGERY          Home Medications    Prior to Admission medications   Medication Sig Start Date End Date Taking? Authorizing Provider  hydrochlorothiazide (HYDRODIURIL) 25 MG tablet Take 1 tablet (25 mg total) by mouth daily. 09/13/12   Lorre MunroeBaity, Regina W, NP  HYDROcodone-acetaminophen (NORCO) 5-325 MG per tablet Take 1 tablet by mouth every 6 (six) hours as needed for moderate pain. 10/03/14   Lenda KelpHudnall, Shane R, MD  metFORMIN (GLUCOPHAGE) 500 MG tablet Take 1 tablet (500 mg total) by mouth daily with breakfast. 09/13/12   Lorre MunroeBaity, Regina W, NP  oxyCODONE-acetaminophen (PERCOCET/ROXICET) 5-325 MG per tablet  09/19/14   [provider]  predniSONE (DELTASONE) 10 MG tablet 6 tabs po day 1, 5 tabs po day 2, 4 tabs po day 3, 3 tabs po day 4, 2 tabs po day 5, 1 tab po day 6 09/19/14   Hudnall, Azucena FallenShane R, MD  simvastatin (ZOCOR) 20 MG tablet Take 1 tablet (20 mg total) by mouth at bedtime. 09/13/12   Lorre MunroeBaity, Regina W, NP  traMADol (ULTRAM) 50 MG tablet Take one tablet by mouth every 8 hours as needed for severe pain. 10/25/14   Lenda KelpHudnall, Shane R, MD    Family History Family History  Problem Relation Age of Onset  . Heart disease Father   .  Cancer Neg Hx   . Diabetes Neg Hx   . Stroke Neg Hx     Social History Social History   Tobacco Use  . Smoking status: Never Smoker  . Smokeless tobacco: Current User  Substance Use Topics  . Alcohol use: Yes    Alcohol/week: 16.8 oz    Types: 28 Cans of beer per week  . Drug use: No     Allergies   Patient has no known allergies.   Review of Systems Review of Systems  Unable to perform ROS: Acuity of condition     Physical Exam Updated Vital Signs BP (!) 0/0   Pulse (!) 0   Temp (!) 96.4 F (35.8 C) (Temporal)   Resp (!) 0   Ht 5\' 10"  (1.778 m)   Wt 124.7 kg (275 lb)   SpO2 (!) 0%   BMI 39.46 kg/m   Physical Exam  Constitutional: He appears well-developed and well-nourished.    HENT:  8.0 tube present at 27 at the teeth.  Face and ears are cyanotic  Eyes:  4mm and nonreactive  Neck: Neck supple.  Cardiovascular:  No heart sounds and pulseless  Pulmonary/Chest:  Decreased breath sounds on the right.  Clear on the left  Abdominal: Soft.  Musculoskeletal:  IO present in the left tib/fib  Neurological:  GCS 3 and unresponsive  Skin:  Cool and cyanotic  Psychiatric:  unresponsive  Nursing note and vitals reviewed.    ED Treatments / Results  Labs (all labs ordered are listed, but only abnormal results are displayed) Labs Reviewed  CBC - Abnormal; Notable for the following components:      Result Value   MCV 101.3 (*)    All other components within normal limits  COMPREHENSIVE METABOLIC PANEL - Abnormal; Notable for the following components:   CO2 13 (*)    Glucose, Bld 651 (*)    Creatinine, Ser 1.53 (*)    Calcium 8.6 (*)    Total Protein 5.2 (*)    Albumin 3.2 (*)    AST 62 (*)    ALT 80 (*)    GFR calc non Af Amer 48 (*)    GFR calc Af Amer 55 (*)    Anion gap 20 (*)    All other components within normal limits  I-STAT TROPONIN, ED - Abnormal; Notable for the following components:   Troponin i, poc 0.22 (*)    All other components within normal limits  I-STAT CG4 LACTIC ACID, ED - Abnormal; Notable for the following components:   Lactic Acid, Venous 13.24 (*)    All other components within normal limits  CBG MONITORING, ED - Abnormal; Notable for the following components:   Glucose-Capillary 579 (*)    All other components within normal limits  I-STAT CHEM 8, ED    EKG None  Radiology No results found.  Procedures Procedures (including critical care time)  Medications Ordered in ED Medications  EPINEPHrine (ADRENALIN) 1 MG/10ML injection (1 Syringe Intravenous Given 2017-08-25 1944)  calcium chloride injection (1 g Intravenous Given 25-Aug-2017 1938)  sodium bicarbonate injection (50 mEq Intravenous Given Aug 25, 2017 1938)  magnesium  sulfate (IV Push/IM) injection (2 g Intravenous Given 25-Aug-2017 1945)     Initial Impression / Assessment and Plan / ED Course  I have reviewed the triage vital signs and the nursing notes.  Pertinent labs & imaging results that were available during my care of the patient were reviewed by me and considered in my medical  decision making (see chart for details).     Patient presenting in cardiac arrest.  Rhythm here is ventricular fibrillation.  Patient remained on the [facility] intubated with an 8.0 tube and breath sounds are present.  Patient given 2 rounds of epi here as well as magnesium, bicarb, calcium without change in rhythm.  Patient then went into asystole after 20 to 30 minutes of CPR here.  Time of death was 107.  When attempting to get a hold of patient's PCP the one in the chart last saw him 4 to 5 years ago.  Unclear the patient had a regular doctor he was seeing but may have seen in urgent care today but unclear which one and family was unaware.  I will sign the death certificate.  Final Clinical Impressions(s) / ED Diagnoses   Final diagnoses:  Cardiac arrest [facility])    ED Discharge Orders    None       Gwyneth Sprout, MD 2017-08-19 [phone]

## 2017-08-30 NOTE — ED Notes (Signed)
Family at bedside. 

## 2017-08-30 DEATH — deceased
